# Patient Record
Sex: Male | Born: 1975 | Race: White | Hispanic: No | Marital: Married | Smoking: Never smoker
Health system: Southern US, Community
[De-identification: ages and names within clinical notes are randomized; demographics above are authoritative.]

## PROBLEM LIST (undated history)

## (undated) DIAGNOSIS — F32A Depression, unspecified: Secondary | ICD-10-CM

## (undated) DIAGNOSIS — E785 Hyperlipidemia, unspecified: Secondary | ICD-10-CM

## (undated) DIAGNOSIS — F329 Major depressive disorder, single episode, unspecified: Secondary | ICD-10-CM

## (undated) DIAGNOSIS — F909 Attention-deficit hyperactivity disorder, unspecified type: Secondary | ICD-10-CM

## (undated) DIAGNOSIS — N200 Calculus of kidney: Secondary | ICD-10-CM

## (undated) DIAGNOSIS — J189 Pneumonia, unspecified organism: Secondary | ICD-10-CM

## (undated) DIAGNOSIS — R112 Nausea with vomiting, unspecified: Secondary | ICD-10-CM

## (undated) DIAGNOSIS — T8859XA Other complications of anesthesia, initial encounter: Secondary | ICD-10-CM

## (undated) DIAGNOSIS — Z9889 Other specified postprocedural states: Secondary | ICD-10-CM

## (undated) DIAGNOSIS — F419 Anxiety disorder, unspecified: Secondary | ICD-10-CM

## (undated) DIAGNOSIS — I10 Essential (primary) hypertension: Secondary | ICD-10-CM

## (undated) DIAGNOSIS — T4145XA Adverse effect of unspecified anesthetic, initial encounter: Secondary | ICD-10-CM

## (undated) HISTORY — DX: Essential (primary) hypertension: I10

## (undated) HISTORY — DX: Hyperlipidemia, unspecified: E78.5

## (undated) HISTORY — DX: Depression, unspecified: F32.A

## (undated) HISTORY — DX: Major depressive disorder, single episode, unspecified: F32.9

## (undated) HISTORY — DX: Attention-deficit hyperactivity disorder, unspecified type: F90.9

## (undated) HISTORY — PX: ORTHOPEDIC SURGERY: SHX850

---

## 2008-01-10 ENCOUNTER — Ambulatory Visit: Payer: Self-pay | Admitting: Family Medicine

## 2008-01-15 ENCOUNTER — Telehealth (INDEPENDENT_AMBULATORY_CARE_PROVIDER_SITE_OTHER): Payer: Self-pay | Admitting: *Deleted

## 2008-10-15 ENCOUNTER — Ambulatory Visit: Payer: Self-pay | Admitting: Family Medicine

## 2008-10-15 DIAGNOSIS — M25569 Pain in unspecified knee: Secondary | ICD-10-CM | POA: Insufficient documentation

## 2008-10-15 DIAGNOSIS — M25519 Pain in unspecified shoulder: Secondary | ICD-10-CM | POA: Insufficient documentation

## 2008-10-15 DIAGNOSIS — M79609 Pain in unspecified limb: Secondary | ICD-10-CM

## 2008-10-19 ENCOUNTER — Encounter: Payer: Self-pay | Admitting: Family Medicine

## 2008-11-06 ENCOUNTER — Ambulatory Visit: Payer: Self-pay | Admitting: Diagnostic Radiology

## 2008-11-06 ENCOUNTER — Emergency Department (HOSPITAL_BASED_OUTPATIENT_CLINIC_OR_DEPARTMENT_OTHER): Admission: EM | Admit: 2008-11-06 | Discharge: 2008-11-07 | Payer: Self-pay | Admitting: Emergency Medicine

## 2009-11-29 ENCOUNTER — Ambulatory Visit: Payer: Self-pay | Admitting: Internal Medicine

## 2011-12-23 ENCOUNTER — Emergency Department (HOSPITAL_BASED_OUTPATIENT_CLINIC_OR_DEPARTMENT_OTHER)
Admission: EM | Admit: 2011-12-23 | Discharge: 2011-12-24 | Disposition: A | Discharge status: Home / Self Care | Payer: Managed Care, Other (non HMO) | Attending: Emergency Medicine | Admitting: Emergency Medicine

## 2011-12-23 ENCOUNTER — Emergency Department (HOSPITAL_BASED_OUTPATIENT_CLINIC_OR_DEPARTMENT_OTHER): Payer: Managed Care, Other (non HMO)

## 2011-12-23 ENCOUNTER — Encounter (HOSPITAL_BASED_OUTPATIENT_CLINIC_OR_DEPARTMENT_OTHER): Payer: Self-pay | Admitting: Emergency Medicine

## 2011-12-23 DIAGNOSIS — S46909A Unspecified injury of unspecified muscle, fascia and tendon at shoulder and upper arm level, unspecified arm, initial encounter: Secondary | ICD-10-CM | POA: Insufficient documentation

## 2011-12-23 DIAGNOSIS — S43109A Unspecified dislocation of unspecified acromioclavicular joint, initial encounter: Secondary | ICD-10-CM

## 2011-12-23 DIAGNOSIS — S4980XA Other specified injuries of shoulder and upper arm, unspecified arm, initial encounter: Secondary | ICD-10-CM | POA: Insufficient documentation

## 2011-12-23 MED ORDER — HYDROCODONE-ACETAMINOPHEN 5-325 MG PO TABS: 1.0000 | ORAL_TABLET | Freq: Four times a day (QID) | ORAL | Status: DC | PRN

## 2011-12-23 MED ORDER — NAPROXEN 250 MG PO TABS
500.0000 mg | ORAL_TABLET | Freq: Two times a day (BID) | ORAL | Status: DC
  Administered 2011-12-24: 500 mg via ORAL

## 2011-12-23 MED ORDER — HYDROCODONE-ACETAMINOPHEN 5-325 MG PO TABS
1.0000 | ORAL_TABLET | Freq: Once | ORAL | Status: AC
  Administered 2011-12-24: 1 via ORAL
  Filled 2011-12-23: qty 1
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
## 2011-12-24 MED ORDER — NAPROXEN 250 MG PO TABS
ORAL_TABLET | ORAL | Status: AC
  Filled 2011-12-24: qty 2

## 2012-06-20 ENCOUNTER — Ambulatory Visit: Payer: Managed Care, Other (non HMO) | Admitting: Family Medicine

## 2012-08-05 ENCOUNTER — Encounter: Payer: Self-pay | Admitting: Family Medicine

## 2012-08-05 ENCOUNTER — Ambulatory Visit (INDEPENDENT_AMBULATORY_CARE_PROVIDER_SITE_OTHER): Payer: Managed Care, Other (non HMO) | Admitting: Family Medicine

## 2012-08-05 VITALS — BP 122/90 | HR 69 | Temp 98.5°F | Ht 70.25 in | Wt 182.0 lb

## 2012-08-05 DIAGNOSIS — F988 Other specified behavioral and emotional disorders with onset usually occurring in childhood and adolescence: Secondary | ICD-10-CM

## 2012-08-05 MED ORDER — AMPHETAMINE-DEXTROAMPHET ER 20 MG PO CP24: 20.0000 mg | ORAL_CAPSULE | ORAL | Status: DC

## 2012-08-05 NOTE — Patient Instructions (Addendum)
Follow up in 1 month to recheck ADD and blood pressure Start the Adderall 1 tab daily If you have weight loss, palpitations, or insomnia- please call! Happy Memorial Day!

## 2012-08-05 NOTE — Progress Notes (Signed)
  Subjective:    Patient ID: Keith Johnson, male    DOB: 1976/03/15, 37 y.o.   MRN: [id number]  HPI Pt here today to re-establish.  Trouble concentrating- pt reports 'it's kinda always been there'.  Is doing more test taking for work and having a hard time studying and focusing, 'my mind's all over the place'.  Struggled in school w/ similar.  Will lose focus after reading about 3 lines.  Trouble focusing occurs both at home and at work.  Wife will frequently need to repeat herself.  Dad w/ similar sxs.  Has been on meds previously- ritalin w/ some improvement in grades.  Dad takes Adderall.  Had ADD testing done while in school.  Middle son is currently on ADD meds- has been on 4 different meds.  Pt very aware of meds.  Has taken Adderall w/ good results.   Review of Systems For ROS see HPI     Objective:   Physical Exam  Vitals reviewed. Constitutional: He is oriented to person, place, and time. He appears well-developed and well-nourished. No distress.  HENT:  Head: Normocephalic and atraumatic.  Neck: Normal range of motion. Neck supple. No thyromegaly present.  Cardiovascular: Normal rate, regular rhythm, normal heart sounds and intact distal pulses.   Pulmonary/Chest: Effort normal and breath sounds normal. No respiratory distress. He has no wheezes. He has no rales.  Musculoskeletal: He exhibits no edema.  Lymphadenopathy:    He has no cervical adenopathy.  Neurological: He is alert and oriented to person, place, and time. No cranial nerve deficit.  Skin: Skin is warm and dry.  Psychiatric: He has a normal mood and affect. His behavior is normal. Thought content normal.          Assessment & Plan:

## 2012-08-06 NOTE — Assessment & Plan Note (Signed)
New to provider.  Recurrent for pt.  Start stimulant medication.  Reviewed controlled substance policy and pt completed UDS.  Will continue to follow closely.

## 2012-08-19 ENCOUNTER — Encounter: Payer: Self-pay | Admitting: Family Medicine

## 2012-09-05 ENCOUNTER — Ambulatory Visit: Payer: Managed Care, Other (non HMO) | Admitting: Family Medicine

## 2012-09-05 ENCOUNTER — Telehealth: Payer: Self-pay | Admitting: Family Medicine

## 2012-09-05 NOTE — Telephone Encounter (Signed)
Last refill:08-05-12 Last OV:08-05-12- f/u in 1 mos-has appt sch'ed for-09-10-12 Please advise.//AB/CMA

## 2012-09-05 NOTE — Telephone Encounter (Signed)
Patient's wife is calling and asking for a refill on the patient's Adderall Rx. States that he took his last one this morning. She also wants to know if he needs to keep his appointment in order to get this refilled.

## 2012-09-06 MED ORDER — AMPHETAMINE-DEXTROAMPHET ER 20 MG PO CP24: 20.0000 mg | ORAL_CAPSULE | ORAL | Status: DC

## 2012-09-06 NOTE — Telephone Encounter (Signed)
Ok for #30 

## 2012-09-06 NOTE — Telephone Encounter (Signed)
Spoke with the pt and informed him that Dr. Tawni Carnes to refill his Adderall,but he will need to keep his appt for f/u.  Pt agreed.//AB/CMA

## 2012-09-10 ENCOUNTER — Ambulatory Visit (INDEPENDENT_AMBULATORY_CARE_PROVIDER_SITE_OTHER): Payer: Managed Care, Other (non HMO) | Admitting: Family Medicine

## 2012-09-10 ENCOUNTER — Encounter: Payer: Self-pay | Admitting: Family Medicine

## 2012-09-10 VITALS — BP 138/80 | HR 69 | Temp 98.1°F | Ht 70.25 in | Wt 183.2 lb

## 2012-09-10 DIAGNOSIS — Z0000 Encounter for general adult medical examination without abnormal findings: Secondary | ICD-10-CM

## 2012-09-10 DIAGNOSIS — F988 Other specified behavioral and emotional disorders with onset usually occurring in childhood and adolescence: Secondary | ICD-10-CM

## 2012-09-10 LAB — CBC WITH DIFFERENTIAL/PLATELET
Basophils Relative: 0.4 % (ref 0.0–3.0)
Eosinophils Absolute: 0.5 10*3/uL (ref 0.0–0.7)
HCT: 44.7 % (ref 39.0–52.0)
Lymphs Abs: 1.9 10*3/uL (ref 0.7–4.0)
MCHC: 33.3 g/dL (ref 30.0–36.0)
MCV: 94.4 fl (ref 78.0–100.0)
Monocytes Absolute: 0.6 10*3/uL (ref 0.1–1.0)
Neutrophils Relative %: 48.5 % (ref 43.0–77.0)
Platelets: 227 10*3/uL (ref 150.0–400.0)

## 2012-09-10 LAB — TSH: TSH: 1.81 u[IU]/mL (ref 0.35–5.50)

## 2012-09-10 LAB — BASIC METABOLIC PANEL
BUN: 14 mg/dL (ref 6–23)
Calcium: 9.6 mg/dL (ref 8.4–10.5)
GFR: 92.73 mL/min (ref >60.00)
Potassium: 4.2 mEq/L (ref 3.5–5.1)
Sodium: 142 mEq/L (ref 135–145)

## 2012-09-10 LAB — LIPID PANEL
Cholesterol: 222 mg/dL — ABNORMAL HIGH (ref 0–200)
HDL: 42.2 mg/dL (ref >39.00)
Triglycerides: 325 mg/dL — ABNORMAL HIGH (ref 0.0–149.0)
VLDL: 65 mg/dL — ABNORMAL HIGH (ref 0.0–40.0)

## 2012-09-10 LAB — HEPATIC FUNCTION PANEL
AST: 19 U/L (ref 0–37)
Total Bilirubin: 0.6 mg/dL (ref 0.3–1.2)

## 2012-09-10 NOTE — Patient Instructions (Addendum)
Schedule your complete physical at your convenience Continue the Adderall daily Call with any questions or concerns Have a great summer!!!

## 2012-09-10 NOTE — Progress Notes (Signed)
  Subjective:    Patient ID: Keith Johnson, male    DOB: August 26, 1975, 37 y.o.   MRN: [id number]  HPI ADD- pt feels sxs have improved since starting Adderall.  No insomnia.  No palpitations.  Slightly less appetite.  No weight loss.  Able to concentrate and finish tasks.   Review of Systems For ROS see HPI     Objective:   Physical Exam  Vitals reviewed. Constitutional: He is oriented to person, place, and time. He appears well-developed and well-nourished. No distress.  Cardiovascular: Normal rate, regular rhythm, normal heart sounds and intact distal pulses.   Pulmonary/Chest: Effort normal and breath sounds normal. No respiratory distress. He has no wheezes. He has no rales.  Neurological: He is alert and oriented to person, place, and time.  Skin: Skin is warm and dry.  Psychiatric: He has a normal mood and affect. His behavior is normal. Judgment and thought content normal.          Assessment & Plan:

## 2012-09-10 NOTE — Assessment & Plan Note (Signed)
Improved since starting meds.  BP within acceptable range.  No side effects at this time.  Will follow.

## 2012-09-17 ENCOUNTER — Telehealth: Payer: Self-pay | Admitting: *Deleted

## 2012-09-17 MED ORDER — FENOFIBRATE 160 MG PO TABS: 160.0000 mg | ORAL_TABLET | Freq: Every day | ORAL | Status: DC

## 2012-09-17 NOTE — Telephone Encounter (Signed)
Message copied by Verdie Shire on Tue Sep 17, 2012  5:43 PM ------      Message from: Sheliah Hatch      Created: Wed Sep 11, 2012  8:41 AM       Labs look great w/ exception of trigs- these will improve w/ healthy diet and regular exercise.  Would also recommend starting Fenofibrate 160mg  to bring this down ------

## 2012-09-17 NOTE — Telephone Encounter (Signed)
Spoke with the pt and informed him of recent lab results and note.  Pt understood and agreed.  New rx sent to the pharmacy by e-script.//AB/CMA

## 2012-10-09 ENCOUNTER — Other Ambulatory Visit: Payer: Self-pay | Admitting: Family Medicine

## 2012-10-09 ENCOUNTER — Ambulatory Visit (INDEPENDENT_AMBULATORY_CARE_PROVIDER_SITE_OTHER): Payer: Managed Care, Other (non HMO) | Admitting: Family Medicine

## 2012-10-09 ENCOUNTER — Ambulatory Visit
Admission: RE | Admit: 2012-10-09 | Discharge: 2012-10-09 | Disposition: A | Discharge status: Home / Self Care | Payer: Managed Care, Other (non HMO) | Source: Ambulatory Visit | Attending: Family Medicine | Admitting: Family Medicine

## 2012-10-09 ENCOUNTER — Encounter: Payer: Self-pay | Admitting: Family Medicine

## 2012-10-09 VITALS — BP 130/78 | HR 70 | Temp 98.7°F | Ht 70.25 in | Wt 176.4 lb

## 2012-10-09 DIAGNOSIS — R0781 Pleurodynia: Secondary | ICD-10-CM

## 2012-10-09 DIAGNOSIS — F988 Other specified behavioral and emotional disorders with onset usually occurring in childhood and adolescence: Secondary | ICD-10-CM

## 2012-10-09 DIAGNOSIS — R079 Chest pain, unspecified: Secondary | ICD-10-CM

## 2012-10-09 MED ORDER — AMPHETAMINE-DEXTROAMPHET ER 20 MG PO CP24: 20.0000 mg | ORAL_CAPSULE | ORAL | Status: DC

## 2012-10-09 NOTE — Patient Instructions (Addendum)
Go get your xray at [address] [facility]) ground floor We'll notify you of your results Alternate ice and heat Vicodin for bed Ibuprofen during the day Call with any questions or concerns Hang in there!!!
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
## 2012-10-09 NOTE — Assessment & Plan Note (Signed)
Refill provided
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
## 2012-11-11 ENCOUNTER — Telehealth: Payer: Self-pay | Admitting: Family Medicine

## 2012-11-11 NOTE — Telephone Encounter (Signed)
Patient's wife is calling to request a refill on the patient's Adderall rx. She says that Dr. Beverely Low mentioned to her that they may be able to get a 90-day supply on his next refill.

## 2012-11-11 NOTE — Telephone Encounter (Signed)
Last filled: 10-10-2102 Last OV:10-09-2012 UDS: 07-26-2012-Low Risk  Contract on file  Please advise. SW, CMA

## 2012-11-12 MED ORDER — AMPHETAMINE-DEXTROAMPHET ER 20 MG PO CP24: 20.0000 mg | ORAL_CAPSULE | ORAL | Status: DC

## 2012-11-12 NOTE — Telephone Encounter (Signed)
Ok for #90, no refill 

## 2012-11-12 NOTE — Telephone Encounter (Signed)
Filled as three months supply. Last Rx to be filled on 01/12/2013.

## 2013-01-28 ENCOUNTER — Telehealth: Payer: Self-pay | Admitting: Family Medicine

## 2013-01-28 MED ORDER — AMPHETAMINE-DEXTROAMPHET ER 20 MG PO CP24: 20.0000 mg | ORAL_CAPSULE | ORAL | Status: DC

## 2013-01-28 NOTE — Telephone Encounter (Signed)
Med filled #90 pt will be notified.

## 2013-01-28 NOTE — Telephone Encounter (Signed)
Last OV 7-23 Med filled for #90 on 8/26

## 2013-01-28 NOTE — Telephone Encounter (Signed)
Patient needs a refill on his Adderall rx. He has enough to last until the last week of this month but is asking for another 90-day supply. Please advise.

## 2013-01-28 NOTE — Telephone Encounter (Signed)
Ok for #90, no refills 

## 2013-05-10 ENCOUNTER — Other Ambulatory Visit: Payer: Self-pay | Admitting: Family Medicine

## 2013-05-13 NOTE — Telephone Encounter (Signed)
Med filled.  

## 2013-05-30 ENCOUNTER — Ambulatory Visit: Payer: Managed Care, Other (non HMO) | Admitting: Family Medicine

## 2013-06-02 ENCOUNTER — Encounter: Payer: Self-pay | Admitting: Family Medicine

## 2013-06-02 ENCOUNTER — Ambulatory Visit (INDEPENDENT_AMBULATORY_CARE_PROVIDER_SITE_OTHER): Payer: Managed Care, Other (non HMO) | Admitting: Family Medicine

## 2013-06-02 VITALS — BP 130/86 | HR 86 | Temp 98.2°F | Resp 16 | Wt 179.1 lb

## 2013-06-02 DIAGNOSIS — F988 Other specified behavioral and emotional disorders with onset usually occurring in childhood and adolescence: Secondary | ICD-10-CM

## 2013-06-02 DIAGNOSIS — E781 Pure hyperglyceridemia: Secondary | ICD-10-CM | POA: Insufficient documentation

## 2013-06-02 DIAGNOSIS — I10 Essential (primary) hypertension: Secondary | ICD-10-CM

## 2013-06-02 LAB — HEPATIC FUNCTION PANEL
ALBUMIN: 4.6 g/dL (ref 3.5–5.2)
ALT: 22 U/L (ref 0–53)
AST: 23 U/L (ref 0–37)
Alkaline Phosphatase: 52 U/L (ref 39–117)
Bilirubin, Direct: 0 mg/dL (ref 0.0–0.3)
TOTAL PROTEIN: 7.6 g/dL (ref 6.0–8.3)
Total Bilirubin: 0.9 mg/dL (ref 0.3–1.2)

## 2013-06-02 LAB — BASIC METABOLIC PANEL
BUN: 12 mg/dL (ref 6–23)
CALCIUM: 9.3 mg/dL (ref 8.4–10.5)
CO2: 28 mEq/L (ref 19–32)
CREATININE: 1 mg/dL (ref 0.4–1.5)
Chloride: 108 mEq/L (ref 96–112)
GFR: 92.34 mL/min (ref >60.00)
Glucose, Bld: 88 mg/dL (ref 70–99)
Potassium: 3.6 mEq/L (ref 3.5–5.1)
Sodium: 142 mEq/L (ref 135–145)

## 2013-06-02 LAB — LIPID PANEL
Cholesterol: 220 mg/dL — ABNORMAL HIGH (ref 0–200)
HDL: 48.8 mg/dL (ref >39.00)
LDL Cholesterol: 153 mg/dL — ABNORMAL HIGH (ref 0–99)
TRIGLYCERIDES: 91 mg/dL (ref 0.0–149.0)
Total CHOL/HDL Ratio: 5
VLDL: 18.2 mg/dL (ref 0.0–40.0)

## 2013-06-02 LAB — CBC WITH DIFFERENTIAL/PLATELET
BASOS PCT: 0.4 % (ref 0.0–3.0)
Basophils Absolute: 0 10*3/uL (ref 0.0–0.1)
EOS ABS: 0.5 10*3/uL (ref 0.0–0.7)
EOS PCT: 8.4 % — AB (ref 0.0–5.0)
HCT: 39.9 % (ref 39.0–52.0)
Hemoglobin: 13.3 g/dL (ref 13.0–17.0)
LYMPHS PCT: 30.3 % (ref 12.0–46.0)
Lymphs Abs: 1.7 10*3/uL (ref 0.7–4.0)
MCHC: 33.3 g/dL (ref 30.0–36.0)
MCV: 92.8 fl (ref 78.0–100.0)
Monocytes Absolute: 0.6 10*3/uL (ref 0.1–1.0)
Monocytes Relative: 10.5 % (ref 3.0–12.0)
NEUTROS PCT: 50.4 % (ref 43.0–77.0)
Neutro Abs: 2.8 10*3/uL (ref 1.4–7.7)
Platelets: 245 10*3/uL (ref 150.0–400.0)
RBC: 4.3 Mil/uL (ref 4.22–5.81)
RDW: 13.7 % (ref 11.5–14.6)
WBC: 5.6 10*3/uL (ref 4.5–10.5)

## 2013-06-02 LAB — TSH: TSH: 1.82 u[IU]/mL (ref 0.35–5.50)

## 2013-06-02 MED ORDER — AMPHETAMINE-DEXTROAMPHET ER 20 MG PO CP24: 20.0000 mg | ORAL_CAPSULE | ORAL | Status: DC

## 2013-06-02 MED ORDER — VALSARTAN 80 MG PO TABS: 80.0000 mg | ORAL_TABLET | Freq: Every day | ORAL | Status: DC

## 2013-06-02 NOTE — Patient Instructions (Signed)
Follow up in 3-4 weeks to recheck BP Start the Valsartan daily for blood pressure Try and limit your sodium (salt) intake, get regular exercise, and limit your caffeine to improve BP Call with any questions or concerns Hang in there!!!

## 2013-06-02 NOTE — Progress Notes (Signed)
Pre visit review using our clinic review tool, if applicable. No additional management support is needed unless otherwise documented below in the visit note. 

## 2013-06-02 NOTE — Progress Notes (Signed)
   Subjective:    Patient ID: Jeffie PollockDaniel Hoffmaster, male    DOB: 1975/09/09, 38 y.o.   MRN: [id number]  HPI Elevated BP- pt reports home BPs are 150s/90s.  No visual changes.  Intermittent HAs.  Rare, intermittent chest ache.  No SOB.  No edema.  + family hx of HTN.  Pt notes elevated BPs x3-4 months.  Has tried stopping Adderall to see if this improves BP- no change noted.  ADD- chronic problem, on Adderall.    Hypertriglyceridemia- noted on last labs, on fenofibrate.   Review of Systems For ROS see HPI     Objective:   Physical Exam  Vitals reviewed. Constitutional: He is oriented to person, place, and time. He appears well-developed and well-nourished. No distress.  HENT:  Head: Normocephalic and atraumatic.  Eyes: Conjunctivae and EOM are normal. Pupils are equal, round, and reactive to light.  Neck: Normal range of motion. Neck supple. No thyromegaly present.  Cardiovascular: Normal rate, regular rhythm, normal heart sounds and intact distal pulses.   No murmur heard. Pulmonary/Chest: Effort normal and breath sounds normal. No respiratory distress.  Abdominal: Soft. Bowel sounds are normal. He exhibits no distension.  Musculoskeletal: He exhibits no edema.  Lymphadenopathy:    He has no cervical adenopathy.  Neurological: He is alert and oriented to person, place, and time. No cranial nerve deficit.  Skin: Skin is warm and dry.  Psychiatric: He has a normal mood and affect. His behavior is normal.          Assessment & Plan:

## 2013-06-02 NOTE — Assessment & Plan Note (Signed)
Noted on last labs.  Pt on fenofibrate w/o difficulty.  Check labs.  Adjust meds prn

## 2013-06-02 NOTE — Assessment & Plan Note (Signed)
New.  Pt w/ strong family hx of HTN.  BP has been elevated for ~3 months and did not fluctuate or improve when pt stopped stimulant medication.  Discussed need for low sodium diet, limited caffeine intake, stress outlet.  Will start low dose ARB and monitor closely.  Pt expressed understanding and is in agreement w/ plan.

## 2013-06-02 NOTE — Assessment & Plan Note (Signed)
Chronic problem.  Refill provided for meds.

## 2013-06-03 ENCOUNTER — Telehealth: Payer: Self-pay | Admitting: Family Medicine

## 2013-06-03 NOTE — Telephone Encounter (Signed)
Relevant patient education assigned to patient using Emmi. ° °

## 2013-07-01 ENCOUNTER — Encounter: Payer: Self-pay | Admitting: Family Medicine

## 2013-08-01 ENCOUNTER — Encounter: Payer: Self-pay | Admitting: Family Medicine

## 2013-08-01 ENCOUNTER — Ambulatory Visit (INDEPENDENT_AMBULATORY_CARE_PROVIDER_SITE_OTHER): Payer: Managed Care, Other (non HMO) | Admitting: Family Medicine

## 2013-08-01 VITALS — BP 136/86 | HR 97 | Temp 98.2°F | Resp 16 | Wt 178.2 lb

## 2013-08-01 DIAGNOSIS — J0110 Acute frontal sinusitis, unspecified: Secondary | ICD-10-CM

## 2013-08-01 DIAGNOSIS — H6120 Impacted cerumen, unspecified ear: Secondary | ICD-10-CM

## 2013-08-01 MED ORDER — AMOXICILLIN 875 MG PO TABS: 875.0000 mg | ORAL_TABLET | Freq: Two times a day (BID) | ORAL | Status: DC

## 2013-08-01 NOTE — Assessment & Plan Note (Signed)
Pt's sxs and PE consistent w/ infxn.  Start abx.  Reviewed supportive care and red flags that should prompt return.  Pt expressed understanding and is in agreement w/ plan.  

## 2013-08-01 NOTE — Assessment & Plan Note (Addendum)
New.  Unable to curette successfully.  Irrigated w/ results.  Wax very hard and impacted.  Attempts at curette were painful for pt.  Stopped attempts and instructed pt to use H2O2 daily in both ears to attempt to soften and return in 10-14 days for removal.  Pt expressed understanding and is in agreement w/ plan.

## 2013-08-01 NOTE — Patient Instructions (Signed)
Follow up as needed Start the Amoxicillin twice daily- take w/ food Drink plenty of fluids Mucinex to thin congestion Call with any questions or concerns Hang in there!!

## 2013-08-01 NOTE — Progress Notes (Signed)
Pre visit review using our clinic review tool, if applicable. No additional management support is needed unless otherwise documented below in the visit note. 

## 2013-08-01 NOTE — Progress Notes (Signed)
   Subjective:    Patient ID: Keith Johnson, male    DOB: 1975/12/06, 38 y.o.   MRN: [id number]  HPI URI- sxs started 2 weeks ago w/ nasal congestion.  No fever.  Now w/ 3 days of facial pain, concentrated over L eye.  No ear pain.  Minimal cough.  No N/V/D.  + HA.  Taking OTC Mucinex w/o relief.  Pain to lean forward.   Review of Systems For ROS see HPI     Objective:   Physical Exam  Vitals reviewed. Constitutional: He appears well-developed and well-nourished. No distress.  HENT:  Head: Normocephalic and atraumatic.  Nose: Mucosal edema and rhinorrhea present. Right sinus exhibits no maxillary sinus tenderness and no frontal sinus tenderness. Left sinus exhibits frontal sinus tenderness. Left sinus exhibits no maxillary sinus tenderness.  Mouth/Throat: Mucous membranes are normal. Oropharyngeal exudate and posterior oropharyngeal erythema present. No posterior oropharyngeal edema.  TM's obscured by wax bilaterally + PND  Eyes: Conjunctivae and EOM are normal. Pupils are equal, round, and reactive to light.  Neck: Normal range of motion. Neck supple.  Cardiovascular: Normal rate, regular rhythm and normal heart sounds.   Pulmonary/Chest: Effort normal and breath sounds normal. No respiratory distress. He has no wheezes.  Lymphadenopathy:    He has no cervical adenopathy.  Skin: Skin is warm and dry.          Assessment & Plan:

## 2013-09-10 ENCOUNTER — Ambulatory Visit (INDEPENDENT_AMBULATORY_CARE_PROVIDER_SITE_OTHER): Payer: Managed Care, Other (non HMO) | Admitting: Family Medicine

## 2013-09-10 ENCOUNTER — Encounter: Payer: Self-pay | Admitting: Family Medicine

## 2013-09-10 VITALS — BP 130/80 | HR 68 | Temp 97.9°F | Resp 16 | Wt 176.5 lb

## 2013-09-10 DIAGNOSIS — J3089 Other allergic rhinitis: Secondary | ICD-10-CM

## 2013-09-10 DIAGNOSIS — J309 Allergic rhinitis, unspecified: Secondary | ICD-10-CM | POA: Insufficient documentation

## 2013-09-10 DIAGNOSIS — H6123 Impacted cerumen, bilateral: Secondary | ICD-10-CM

## 2013-09-10 DIAGNOSIS — H6120 Impacted cerumen, unspecified ear: Secondary | ICD-10-CM

## 2013-09-10 MED ORDER — AMPHETAMINE-DEXTROAMPHET ER 20 MG PO CP24: 20.0000 mg | ORAL_CAPSULE | ORAL | Status: DC

## 2013-09-10 MED ORDER — FLUTICASONE PROPIONATE 50 MCG/ACT NA SUSP: 2.0000 | Freq: Every day | NASAL | Status: DC

## 2013-09-10 NOTE — Assessment & Plan Note (Addendum)
Despite pt's home use of H2O2, he continues to have hard, painfully adhered balls or wax in canals bilaterally.  Was able to remove the soft wax in front of the solid ball but was unable to remove the remainder of wax w/ either curette or irrigation.  Refer to ENT.

## 2013-09-10 NOTE — Patient Instructions (Signed)
Follow up as needed Start Claritin or Zyrtec daily for nasal congestion and post nasal drip- this is what's causing your cough Drink plenty of fluids Flonase- 2 sprays each nostril daily- to decrease post nasal drip and improve cough Continue Mucinex DM for daytime cough Call with any questions or concerns Have a great summer!

## 2013-09-10 NOTE — Assessment & Plan Note (Addendum)
New.  Lungs CTAB but copious PND on PE.  Start daily antihistamine, nasal steroid to decrease congestion and drainage and improve cough.  Reviewed supportive care and red flags that should prompt return.  Pt expressed understanding and is in agreement w/ plan.

## 2013-09-10 NOTE — Progress Notes (Signed)
   Subjective:    Patient ID: Keith CostainDaniel T Johnson, male    DOB: May 30, 1975, 38 y.o.   MRN: [id number]  HPI Cough- sxs started ~1 month ago after sinus infxn resolved.  No fevers.  Hacking, productive cough.  No facial pain/pressure.  Having some AM nasal drainage and congestion.  Cerumen impaction- chronic problem for pt.  Has been using peroxide regularly w/ some improvement in ear congestion.   Review of Systems For ROS see HPI     Objective:   Physical Exam  Vitals reviewed. Constitutional: He appears well-developed and well-nourished. No distress.  HENT:  Head: Normocephalic and atraumatic.  No TTP over sinuses + turbinate edema + PND TMs obscured by cerumen bilaterally.  Able to curette a small amount of foul smelling soft cerumen but pt has large amount of hard, adhered cerumen in ear canals bilaterally- attempting to irrigate  Eyes: Conjunctivae and EOM are normal. Pupils are equal, round, and reactive to light.  Neck: Normal range of motion. Neck supple.  Cardiovascular: Normal rate, regular rhythm and normal heart sounds.   Pulmonary/Chest: Effort normal and breath sounds normal. No respiratory distress. He has no wheezes.  + hacking cough  Lymphadenopathy:    He has no cervical adenopathy.  Skin: Skin is warm and dry.          Assessment & Plan:

## 2013-09-10 NOTE — Progress Notes (Signed)
Pre visit review using our clinic review tool, if applicable. No additional management support is needed unless otherwise documented below in the visit note. 

## 2013-10-05 ENCOUNTER — Other Ambulatory Visit: Payer: Self-pay | Admitting: Family Medicine

## 2013-10-06 NOTE — Telephone Encounter (Signed)
Med filled.  

## 2013-12-12 ENCOUNTER — Telehealth: Payer: Self-pay

## 2013-12-12 MED ORDER — AMPHETAMINE-DEXTROAMPHET ER 20 MG PO CP24: 20.0000 mg | ORAL_CAPSULE | ORAL | Status: DC

## 2013-12-12 NOTE — Telephone Encounter (Signed)
Apolonio Cutting Spouse [phone]  Erin called to say Keith Johnson just used his last amphetamine-dextroamphetamine (ADDERALL XR) 20 MG 24 hr capsule, she wanted to know if he could get 3 months of prescriptions. Call when ready

## 2013-12-12 NOTE — Telephone Encounter (Signed)
Med filled and pt notified.  

## 2013-12-12 NOTE — Telephone Encounter (Signed)
Last OV 09-10-13 Med filled 90 days at that time.

## 2013-12-12 NOTE — Telephone Encounter (Signed)
Ok for 3 months supply

## 2013-12-24 ENCOUNTER — Other Ambulatory Visit: Payer: Self-pay | Admitting: General Practice

## 2013-12-24 MED ORDER — FENOFIBRATE 160 MG PO TABS: ORAL_TABLET | ORAL | Status: DC

## 2014-03-10 ENCOUNTER — Other Ambulatory Visit: Payer: Self-pay | Admitting: General Practice

## 2014-03-10 MED ORDER — VALSARTAN 80 MG PO TABS: ORAL_TABLET | ORAL | Status: DC

## 2014-04-28 ENCOUNTER — Telehealth: Payer: Self-pay | Admitting: Family Medicine

## 2014-04-28 NOTE — Telephone Encounter (Signed)
Caller name: erin Relation to pt: wife Call back number: [phone] or [phone] Pharmacy:  Reason for call:   Requesting three month supply of adderall for patient

## 2014-04-29 MED ORDER — AMPHETAMINE-DEXTROAMPHET ER 20 MG PO CP24: 20.0000 mg | ORAL_CAPSULE | ORAL | Status: DC

## 2014-04-29 NOTE — Telephone Encounter (Signed)
Will notify pt that CPE is needed.

## 2014-04-29 NOTE — Telephone Encounter (Signed)
Ok for 3 month supply- please remind him to schedule CPE for when it is due

## 2014-04-29 NOTE — Telephone Encounter (Signed)
Last OV 09/10/13 (cerumen impaction) adderall last filled 12/12/13 with 3 months supply.   No upcoming appts

## 2014-07-29 ENCOUNTER — Telehealth: Payer: Self-pay | Admitting: Family Medicine

## 2014-07-29 NOTE — Telephone Encounter (Signed)
Pre Visit letter sent  °

## 2014-08-02 ENCOUNTER — Other Ambulatory Visit: Payer: Self-pay | Admitting: Family Medicine

## 2014-08-03 NOTE — Telephone Encounter (Signed)
Med filled, CPE scheduled for next month.

## 2014-08-06 ENCOUNTER — Other Ambulatory Visit: Payer: Self-pay | Admitting: General Practice

## 2014-08-06 MED ORDER — FENOFIBRATE 160 MG PO TABS: ORAL_TABLET | ORAL | Status: DC

## 2014-08-18 ENCOUNTER — Encounter: Payer: Self-pay | Admitting: *Deleted

## 2014-08-18 ENCOUNTER — Telehealth: Payer: Self-pay | Admitting: *Deleted

## 2014-08-18 NOTE — Telephone Encounter (Signed)
Pre-Visit Call completed with patient and chart updated.   Pre-Visit Info documented in Specialty Comments under [employer].    

## 2014-08-19 ENCOUNTER — Ambulatory Visit (INDEPENDENT_AMBULATORY_CARE_PROVIDER_SITE_OTHER): Payer: 59 | Admitting: Family Medicine

## 2014-08-19 ENCOUNTER — Encounter: Payer: Self-pay | Admitting: Family Medicine

## 2014-08-19 VITALS — BP 124/78 | HR 80 | Temp 98.0°F | Resp 16 | Ht 70.0 in | Wt 179.0 lb

## 2014-08-19 DIAGNOSIS — Z0000 Encounter for general adult medical examination without abnormal findings: Secondary | ICD-10-CM | POA: Diagnosis not present

## 2014-08-19 DIAGNOSIS — F411 Generalized anxiety disorder: Secondary | ICD-10-CM | POA: Diagnosis not present

## 2014-08-19 MED ORDER — FLUOXETINE HCL 20 MG PO TABS: 20.0000 mg | ORAL_TABLET | Freq: Every day | ORAL | Status: DC

## 2014-08-19 MED ORDER — FENOFIBRATE 160 MG PO TABS: ORAL_TABLET | ORAL | Status: DC

## 2014-08-19 MED ORDER — AMPHETAMINE-DEXTROAMPHET ER 20 MG PO CP24: 20.0000 mg | ORAL_CAPSULE | ORAL | Status: DC

## 2014-08-19 MED ORDER — VALSARTAN 80 MG PO TABS: 80.0000 mg | ORAL_TABLET | Freq: Every day | ORAL | Status: DC

## 2014-08-19 NOTE — Progress Notes (Signed)
   Subjective:    Patient ID: Keith Johnson, male    DOB: 1975/04/11, 39 y.o.   MRN: [id number]  HPI CPE- pt reports wife told him to ask about 'crazy pills'.  Pt reports increased stress at work.  Increased snapping, shorter fuse.  Pt attempted to go off adderall b/c he was concerned this was the cause, and sxs were worse.  Denies sadness, 'easily irritated'.  Sleep is fairly good.  Denies stress at home.  'i feel like i care too much'.  'i don't believe in counseling'.   Review of Systems Patient reports no vision/hearing changes, anorexia, fever ,adenopathy, persistant/recurrent hoarseness, swallowing issues, chest pain, palpitations, edema, persistant/recurrent cough, hemoptysis, dyspnea (rest,exertional, paroxysmal nocturnal), gastrointestinal  bleeding (melena, rectal bleeding), abdominal pain, excessive heart burn, GU symptoms (dysuria, hematuria, voiding/incontinence issues) syncope, focal weakness, memory loss, numbness & tingling, skin/hair/nail changes, abnormal bruising/bleeding, musculoskeletal symptoms/signs.     Objective:   Physical Exam General Appearance:    Alert, cooperative, no distress, appears stated age  Head:    Normocephalic, without obvious abnormality, atraumatic  Eyes:    PERRL, conjunctiva/corneas clear, EOM's intact, fundi    benign, both eyes       Ears:    Normal TM's and external ear canals, both ears  Nose:   Nares normal, septum midline, mucosa normal, no drainage   or sinus tenderness  Throat:   Lips, mucosa, and tongue normal; teeth and gums normal  Neck:   Supple, symmetrical, trachea midline, no adenopathy;       thyroid:  No enlargement/tenderness/nodules  Back:     Symmetric, no curvature, ROM normal, no CVA tenderness  Lungs:     Clear to auscultation bilaterally, respirations unlabored  Chest wall:    No tenderness or deformity  Heart:    Regular rate and rhythm, S1 and S2 normal, no murmur, rub   or gallop  Abdomen:     Soft, non-tender,  bowel sounds active all four quadrants,    no masses, no organomegaly  Genitalia:    Normal male without lesion, masses,discharge or tenderness  Rectal:    Deferred due to young age  Extremities:   Extremities normal, atraumatic, no cyanosis or edema  Pulses:   2+ and symmetric all extremities  Skin:   Skin color, texture, turgor normal, no rashes or lesions  Lymph nodes:   Cervical, supraclavicular, and axillary nodes normal  Neurologic:   CNII-XII intact. Normal strength, sensation and reflexes      throughout          Assessment & Plan:

## 2014-08-19 NOTE — Progress Notes (Signed)
Pre visit review using our clinic review tool, if applicable. No additional management support is needed unless otherwise documented below in the visit note. 

## 2014-08-19 NOTE — Patient Instructions (Signed)
Follow up in 4-6 weeks to recheck anxiety We'll notify you of your lab results and make any changes if needed Start the Prozac daily for the anxiety Keep up the good work on healthy diet and regular exercise Call with any questions or concerns Hang in there!!!

## 2014-08-20 ENCOUNTER — Encounter: Payer: Self-pay | Admitting: General Practice

## 2014-08-20 LAB — HEPATIC FUNCTION PANEL
ALK PHOS: 43 U/L (ref 39–117)
ALT: 19 U/L (ref 0–53)
AST: 21 U/L (ref 0–37)
Albumin: 4.5 g/dL (ref 3.5–5.2)
BILIRUBIN TOTAL: 0.3 mg/dL (ref 0.2–1.2)
Bilirubin, Direct: 0 mg/dL (ref 0.0–0.3)
Total Protein: 7.3 g/dL (ref 6.0–8.3)

## 2014-08-20 LAB — CBC WITH DIFFERENTIAL/PLATELET
BASOS PCT: 0.3 % (ref 0.0–3.0)
Basophils Absolute: 0 10*3/uL (ref 0.0–0.1)
EOS PCT: 9.8 % — AB (ref 0.0–5.0)
Eosinophils Absolute: 0.6 10*3/uL (ref 0.0–0.7)
HCT: 37.7 % — ABNORMAL LOW (ref 39.0–52.0)
Hemoglobin: 12.5 g/dL — ABNORMAL LOW (ref 13.0–17.0)
LYMPHS ABS: 1.9 10*3/uL (ref 0.7–4.0)
Lymphocytes Relative: 30.4 % (ref 12.0–46.0)
MCHC: 33.1 g/dL (ref 30.0–36.0)
MCV: 93.2 fl (ref 78.0–100.0)
Monocytes Absolute: 0.6 10*3/uL (ref 0.1–1.0)
Monocytes Relative: 10 % (ref 3.0–12.0)
Neutro Abs: 3.1 10*3/uL (ref 1.4–7.7)
Neutrophils Relative %: 49.5 % (ref 43.0–77.0)
Platelets: 242 10*3/uL (ref 150.0–400.0)
RBC: 4.05 Mil/uL — ABNORMAL LOW (ref 4.22–5.81)
RDW: 13.1 % (ref 11.5–15.5)
WBC: 6.2 10*3/uL (ref 4.0–10.5)

## 2014-08-20 LAB — LIPID PANEL
CHOLESTEROL: 194 mg/dL (ref 0–200)
HDL: 49.3 mg/dL (ref >39.00)
NonHDL: 144.7
Total CHOL/HDL Ratio: 4
Triglycerides: 238 mg/dL — ABNORMAL HIGH (ref 0.0–149.0)
VLDL: 47.6 mg/dL — ABNORMAL HIGH (ref 0.0–40.0)

## 2014-08-20 LAB — BASIC METABOLIC PANEL
BUN: 12 mg/dL (ref 6–23)
CO2: 28 mEq/L (ref 19–32)
Calcium: 9.5 mg/dL (ref 8.4–10.5)
Chloride: 105 mEq/L (ref 96–112)
Creatinine, Ser: 1.11 mg/dL (ref 0.40–1.50)
GFR: 79.43 mL/min (ref >60.00)
GLUCOSE: 79 mg/dL (ref 70–99)
POTASSIUM: 3.9 meq/L (ref 3.5–5.1)
SODIUM: 139 meq/L (ref 135–145)

## 2014-08-20 LAB — TSH: TSH: 2.04 u[IU]/mL (ref 0.35–4.50)

## 2014-08-20 LAB — LDL CHOLESTEROL, DIRECT: LDL DIRECT: 105 mg/dL

## 2014-08-21 NOTE — Assessment & Plan Note (Signed)
Pt's PE WNL.  Check labs.  Anticipatory guidance provided.  

## 2014-08-21 NOTE — Assessment & Plan Note (Signed)
New.  Pt admits that this is interfering w/ his work and his wife has asked him to address this.  Start low dose SSRI.  Reviewed appropriate use, possible side effects- including suicidal ideation.  Will follow closely.

## 2014-09-05 ENCOUNTER — Other Ambulatory Visit: Payer: Self-pay | Admitting: Family Medicine

## 2014-09-07 NOTE — Telephone Encounter (Signed)
Med filled.  

## 2014-09-09 ENCOUNTER — Encounter (HOSPITAL_COMMUNITY): Payer: Self-pay | Admitting: *Deleted

## 2014-09-09 ENCOUNTER — Other Ambulatory Visit: Payer: Self-pay | Admitting: Orthopedic Surgery

## 2014-09-09 NOTE — Progress Notes (Signed)
Pt stated that he had some type of "problem" during his clavicle surgery in 2013 at [facility]. States he was never told what type of complication it was, but it was supposed to be a hour long surgery and it took 2 hours. He said he thought it had to do with anesthesia. Requesting anesthesia records from [facility].

## 2014-09-10 ENCOUNTER — Ambulatory Visit (HOSPITAL_COMMUNITY): Payer: 59 | Admitting: Anesthesiology

## 2014-09-10 ENCOUNTER — Ambulatory Visit (HOSPITAL_COMMUNITY)
Admission: RE | Admit: 2014-09-10 | Discharge: 2014-09-10 | Disposition: A | Discharge status: Home / Self Care | Payer: 59 | Source: Ambulatory Visit | Attending: Orthopedic Surgery | Admitting: Orthopedic Surgery

## 2014-09-10 ENCOUNTER — Encounter (HOSPITAL_COMMUNITY): Payer: Self-pay | Admitting: Certified Registered Nurse Anesthetist

## 2014-09-10 ENCOUNTER — Encounter (HOSPITAL_COMMUNITY)
Admission: RE | Disposition: A | Discharge status: Home / Self Care | Payer: Self-pay | Source: Ambulatory Visit | Attending: Orthopedic Surgery

## 2014-09-10 ENCOUNTER — Ambulatory Visit (HOSPITAL_COMMUNITY): Payer: 59

## 2014-09-10 DIAGNOSIS — Y999 Unspecified external cause status: Secondary | ICD-10-CM | POA: Diagnosis not present

## 2014-09-10 DIAGNOSIS — Y929 Unspecified place or not applicable: Secondary | ICD-10-CM | POA: Insufficient documentation

## 2014-09-10 DIAGNOSIS — R9431 Abnormal electrocardiogram [ECG] [EKG]: Secondary | ICD-10-CM | POA: Diagnosis not present

## 2014-09-10 DIAGNOSIS — F419 Anxiety disorder, unspecified: Secondary | ICD-10-CM | POA: Insufficient documentation

## 2014-09-10 DIAGNOSIS — I4510 Unspecified right bundle-branch block: Secondary | ICD-10-CM | POA: Diagnosis not present

## 2014-09-10 DIAGNOSIS — Z791 Long term (current) use of non-steroidal anti-inflammatories (NSAID): Secondary | ICD-10-CM | POA: Insufficient documentation

## 2014-09-10 DIAGNOSIS — Y939 Activity, unspecified: Secondary | ICD-10-CM | POA: Insufficient documentation

## 2014-09-10 DIAGNOSIS — Z79899 Other long term (current) drug therapy: Secondary | ICD-10-CM | POA: Insufficient documentation

## 2014-09-10 DIAGNOSIS — Z79891 Long term (current) use of opiate analgesic: Secondary | ICD-10-CM | POA: Insufficient documentation

## 2014-09-10 DIAGNOSIS — S42002A Fracture of unspecified part of left clavicle, initial encounter for closed fracture: Secondary | ICD-10-CM | POA: Diagnosis present

## 2014-09-10 DIAGNOSIS — I10 Essential (primary) hypertension: Secondary | ICD-10-CM | POA: Diagnosis not present

## 2014-09-10 DIAGNOSIS — S42009A Fracture of unspecified part of unspecified clavicle, initial encounter for closed fracture: Secondary | ICD-10-CM

## 2014-09-10 DIAGNOSIS — F988 Other specified behavioral and emotional disorders with onset usually occurring in childhood and adolescence: Secondary | ICD-10-CM | POA: Diagnosis not present

## 2014-09-10 HISTORY — DX: Other specified postprocedural states: Z98.890

## 2014-09-10 HISTORY — DX: Other specified postprocedural states: R11.2

## 2014-09-10 HISTORY — PX: ORIF CLAVICULAR FRACTURE: SHX5055

## 2014-09-10 HISTORY — DX: Adverse effect of unspecified anesthetic, initial encounter: T41.45XA

## 2014-09-10 HISTORY — DX: Pneumonia, unspecified organism: J18.9

## 2014-09-10 HISTORY — DX: Other complications of anesthesia, initial encounter: T88.59XA

## 2014-09-10 HISTORY — DX: Anxiety disorder, unspecified: F41.9

## 2014-09-10 LAB — CBC
HCT: 38 % — ABNORMAL LOW (ref 39.0–52.0)
HEMOGLOBIN: 12.6 g/dL — AB (ref 13.0–17.0)
MCH: 30.4 pg (ref 26.0–34.0)
MCHC: 33.2 g/dL (ref 30.0–36.0)
MCV: 91.6 fL (ref 78.0–100.0)
PLATELETS: 229 10*3/uL (ref 150–400)
RBC: 4.15 MIL/uL — AB (ref 4.22–5.81)
RDW: 12.5 % (ref 11.5–15.5)
WBC: 7 10*3/uL (ref 4.0–10.5)

## 2014-09-10 LAB — BASIC METABOLIC PANEL
Anion gap: 4 — ABNORMAL LOW (ref 5–15)
BUN: 11 mg/dL (ref 6–20)
CHLORIDE: 106 mmol/L (ref 101–111)
CO2: 27 mmol/L (ref 22–32)
CREATININE: 1.14 mg/dL (ref 0.61–1.24)
Calcium: 9.2 mg/dL (ref 8.9–10.3)
Glucose, Bld: 90 mg/dL (ref 65–99)
Potassium: 4.4 mmol/L (ref 3.5–5.1)
Sodium: 137 mmol/L (ref 135–145)

## 2014-09-10 SURGERY — OPEN REDUCTION INTERNAL FIXATION (ORIF) CLAVICULAR FRACTURE
Anesthesia: General | Site: Shoulder | Laterality: Left

## 2014-09-10 MED ORDER — ROCURONIUM BROMIDE 50 MG/5ML IV SOLN
INTRAVENOUS | Status: AC
  Filled 2014-09-10: qty 1

## 2014-09-10 MED ORDER — POVIDONE-IODINE 7.5 % EX SOLN
Freq: Once | CUTANEOUS | Status: DC
  Filled 2014-09-10: qty 118

## 2014-09-10 MED ORDER — MEPERIDINE HCL 25 MG/ML IJ SOLN
6.2500 mg | INTRAMUSCULAR | Status: DC | PRN
  Administered 2014-09-10: 6.25 mg via INTRAVENOUS

## 2014-09-10 MED ORDER — MIDAZOLAM HCL 5 MG/5ML IJ SOLN
INTRAMUSCULAR | Status: DC | PRN
  Administered 2014-09-10: 2 mg via INTRAVENOUS

## 2014-09-10 MED ORDER — DOCUSATE SODIUM 100 MG PO CAPS: 100.0000 mg | ORAL_CAPSULE | Freq: Three times a day (TID) | ORAL | Status: DC | PRN

## 2014-09-10 MED ORDER — MIDAZOLAM HCL 2 MG/2ML IJ SOLN
INTRAMUSCULAR | Status: AC
  Filled 2014-09-10: qty 2

## 2014-09-10 MED ORDER — OXYCODONE-ACETAMINOPHEN 5-325 MG PO TABS
2.0000 | ORAL_TABLET | Freq: Once | ORAL | Status: AC
  Administered 2014-09-10: 2 via ORAL

## 2014-09-10 MED ORDER — PROMETHAZINE HCL 25 MG/ML IJ SOLN: 6.2500 mg | INTRAMUSCULAR | Status: DC | PRN

## 2014-09-10 MED ORDER — LACTATED RINGERS IV SOLN
INTRAVENOUS | Status: DC
  Administered 2014-09-10 (×2): via INTRAVENOUS
  Administered 2014-09-10: 50 mL/h via INTRAVENOUS

## 2014-09-10 MED ORDER — FENTANYL CITRATE (PF) 250 MCG/5ML IJ SOLN
INTRAMUSCULAR | Status: AC
  Filled 2014-09-10: qty 5

## 2014-09-10 MED ORDER — PHENYLEPHRINE HCL 10 MG/ML IJ SOLN
INTRAMUSCULAR | Status: DC | PRN
  Administered 2014-09-10: 40 ug via INTRAVENOUS
  Administered 2014-09-10: 80 ug via INTRAVENOUS
  Administered 2014-09-10: 40 ug via INTRAVENOUS

## 2014-09-10 MED ORDER — ATROPINE SULFATE 0.1 MG/ML IJ SOLN
INTRAMUSCULAR | Status: AC
  Filled 2014-09-10: qty 10

## 2014-09-10 MED ORDER — SODIUM CHLORIDE 0.9 % IJ SOLN
INTRAMUSCULAR | Status: AC
  Filled 2014-09-10: qty 40

## 2014-09-10 MED ORDER — EPHEDRINE SULFATE 50 MG/ML IJ SOLN
INTRAMUSCULAR | Status: AC
  Filled 2014-09-10: qty 1

## 2014-09-10 MED ORDER — OXYCODONE-ACETAMINOPHEN 5-325 MG PO TABS: 1.0000 | ORAL_TABLET | ORAL | Status: DC | PRN

## 2014-09-10 MED ORDER — PROPOFOL 10 MG/ML IV BOLUS
INTRAVENOUS | Status: AC
  Filled 2014-09-10: qty 20

## 2014-09-10 MED ORDER — BUPIVACAINE-EPINEPHRINE (PF) 0.25% -1:200000 IJ SOLN
INTRAMUSCULAR | Status: AC
  Filled 2014-09-10: qty 30

## 2014-09-10 MED ORDER — FENTANYL CITRATE (PF) 100 MCG/2ML IJ SOLN
INTRAMUSCULAR | Status: DC | PRN
  Administered 2014-09-10 (×2): 100 ug via INTRAVENOUS

## 2014-09-10 MED ORDER — PROPOFOL 10 MG/ML IV BOLUS
INTRAVENOUS | Status: DC | PRN
  Administered 2014-09-10: 200 mg via INTRAVENOUS

## 2014-09-10 MED ORDER — DIPHENHYDRAMINE HCL 50 MG/ML IJ SOLN
INTRAMUSCULAR | Status: DC | PRN
  Administered 2014-09-10: 25 mg via INTRAVENOUS

## 2014-09-10 MED ORDER — LIDOCAINE HCL (CARDIAC) 20 MG/ML IV SOLN
INTRAVENOUS | Status: DC | PRN
  Administered 2014-09-10: 50 mg via INTRAVENOUS

## 2014-09-10 MED ORDER — MEPERIDINE HCL 25 MG/ML IJ SOLN
INTRAMUSCULAR | Status: AC
  Filled 2014-09-10: qty 1

## 2014-09-10 MED ORDER — PHENYLEPHRINE HCL 10 MG/ML IJ SOLN
10.0000 mg | INTRAVENOUS | Status: DC | PRN
  Administered 2014-09-10: 20 ug/min via INTRAVENOUS

## 2014-09-10 MED ORDER — CEFAZOLIN SODIUM-DEXTROSE 2-3 GM-% IV SOLR
2.0000 g | INTRAVENOUS | Status: AC
  Administered 2014-09-10: 2 g via INTRAVENOUS
  Filled 2014-09-10: qty 50

## 2014-09-10 MED ORDER — ROCURONIUM BROMIDE 100 MG/10ML IV SOLN
INTRAVENOUS | Status: DC | PRN
  Administered 2014-09-10: 50 mg via INTRAVENOUS

## 2014-09-10 MED ORDER — ALBUTEROL SULFATE HFA 108 (90 BASE) MCG/ACT IN AERS
INHALATION_SPRAY | RESPIRATORY_TRACT | Status: AC
  Filled 2014-09-10: qty 6.7

## 2014-09-10 MED ORDER — GLYCOPYRROLATE 0.2 MG/ML IJ SOLN
INTRAMUSCULAR | Status: DC | PRN
  Administered 2014-09-10: 0.6 mg via INTRAVENOUS
  Administered 2014-09-10: 0.2 mg via INTRAVENOUS

## 2014-09-10 MED ORDER — BUPIVACAINE-EPINEPHRINE 0.25% -1:200000 IJ SOLN
INTRAMUSCULAR | Status: DC | PRN
  Administered 2014-09-10: 10 mL

## 2014-09-10 MED ORDER — HYDROMORPHONE HCL 1 MG/ML IJ SOLN
0.2500 mg | INTRAMUSCULAR | Status: DC | PRN
  Administered 2014-09-10 (×2): 0.5 mg via INTRAVENOUS

## 2014-09-10 MED ORDER — LIDOCAINE HCL (CARDIAC) 20 MG/ML IV SOLN
INTRAVENOUS | Status: AC
  Filled 2014-09-10: qty 5

## 2014-09-10 MED ORDER — SODIUM CHLORIDE 0.9 % IR SOLN
Status: DC | PRN
  Administered 2014-09-10: 1000 mL

## 2014-09-10 MED ORDER — HYDROMORPHONE HCL 1 MG/ML IJ SOLN
INTRAMUSCULAR | Status: AC
  Administered 2014-09-10: 0.5 mg via INTRAVENOUS
  Filled 2014-09-10: qty 1

## 2014-09-10 MED ORDER — OXYCODONE-ACETAMINOPHEN 5-325 MG PO TABS
ORAL_TABLET | ORAL | Status: AC
  Administered 2014-09-10: 2 via ORAL
  Filled 2014-09-10: qty 2

## 2014-09-10 MED ORDER — ONDANSETRON HCL 4 MG/2ML IJ SOLN
INTRAMUSCULAR | Status: AC
  Filled 2014-09-10: qty 2

## 2014-09-10 MED ORDER — ONDANSETRON HCL 4 MG/2ML IJ SOLN
INTRAMUSCULAR | Status: DC | PRN
  Administered 2014-09-10: 4 mg via INTRAVENOUS

## 2014-09-10 MED ORDER — NEOSTIGMINE METHYLSULFATE 10 MG/10ML IV SOLN
INTRAVENOUS | Status: DC | PRN
  Administered 2014-09-10: 4 mg via INTRAVENOUS

## 2014-09-10 MED ORDER — DEXAMETHASONE SODIUM PHOSPHATE 4 MG/ML IJ SOLN
INTRAMUSCULAR | Status: DC | PRN
  Administered 2014-09-10: 4 mg via INTRAVENOUS

## 2014-09-10 SURGICAL SUPPLY — 61 items
BIT DRILL 2.0 LNG QUCK RELEASE (BIT) ×1 IMPLANT
BIT DRILL 2.3 QUICK RELEASE (BIT) ×1 IMPLANT
BIT DRILL 2.8X5 QR DISP (BIT) ×3 IMPLANT
CHLORAPREP W/TINT 26ML (MISCELLANEOUS) ×3 IMPLANT
CLOSURE WOUND 1/2 X4 (GAUZE/BANDAGES/DRESSINGS) ×1
COVER SURGICAL LIGHT HANDLE (MISCELLANEOUS) ×3 IMPLANT
DRAPE C-ARM 42X72 X-RAY (DRAPES) ×3 IMPLANT
DRAPE IMP U-DRAPE 54X76 (DRAPES) ×3 IMPLANT
DRAPE INCISE IOBAN 66X45 STRL (DRAPES) ×3 IMPLANT
DRAPE SURG 17X23 STRL (DRAPES) ×3 IMPLANT
DRAPE U-SHAPE 47X51 STRL (DRAPES) ×3 IMPLANT
DRILL 2.0 LNG QUICK RELEASE (BIT) ×3
DRILL 2.3 QUICK RELEASE (BIT) ×3
DRSG MEPILEX BORDER 4X4 (GAUZE/BANDAGES/DRESSINGS) ×3 IMPLANT
ELECT REM PT RETURN 9FT ADLT (ELECTROSURGICAL)
ELECTRODE REM PT RTRN 9FT ADLT (ELECTROSURGICAL) IMPLANT
GLOVE BIO SURGEON STRL SZ7 (GLOVE) ×3 IMPLANT
GLOVE BIO SURGEON STRL SZ7.5 (GLOVE) ×3 IMPLANT
GLOVE BIOGEL PI IND STRL 7.0 (GLOVE) ×1 IMPLANT
GLOVE BIOGEL PI IND STRL 8 (GLOVE) ×1 IMPLANT
GLOVE BIOGEL PI INDICATOR 7.0 (GLOVE) ×2
GLOVE BIOGEL PI INDICATOR 8 (GLOVE) ×2
GOWN STRL REUS W/ TWL LRG LVL3 (GOWN DISPOSABLE) ×3 IMPLANT
GOWN STRL REUS W/ TWL XL LVL3 (GOWN DISPOSABLE) ×1 IMPLANT
GOWN STRL REUS W/TWL LRG LVL3 (GOWN DISPOSABLE) ×6
GOWN STRL REUS W/TWL XL LVL3 (GOWN DISPOSABLE) ×2
KIT BASIN OR (CUSTOM PROCEDURE TRAY) ×3 IMPLANT
KIT BIO-TENODESIS 3X8 DISP (MISCELLANEOUS)
KIT INSRT BABSR STRL DISP BTN (MISCELLANEOUS) IMPLANT
KIT ROOM TURNOVER OR (KITS) ×3 IMPLANT
MANIFOLD NEPTUNE WASTE (CANNULA) ×3 IMPLANT
NEEDLE HYPO 25GX1X1/2 BEV (NEEDLE) ×3 IMPLANT
NEEDLE SPNL 18GX3.5 QUINCKE PK (NEEDLE) IMPLANT
NS IRRIG 1000ML POUR BTL (IV SOLUTION) ×3 IMPLANT
PACK SHOULDER (CUSTOM PROCEDURE TRAY) ×3 IMPLANT
PACK UNIVERSAL I (CUSTOM PROCEDURE TRAY) ×3 IMPLANT
PAD ARMBOARD 7.5X6 YLW CONV (MISCELLANEOUS) ×6 IMPLANT
PLATE LEFT CLAVICLE 8H (Plate) ×3 IMPLANT
RETRIEVER SUT HEWSON (MISCELLANEOUS) IMPLANT
SCREW CORTICAL 2.3X14 (Screw) ×3 IMPLANT
SCREW HEXALOBE LOCKING 3.5X14M (Screw) ×3 IMPLANT
SCREW LOCK 12X3.5X HEXALOBE (Screw) ×1 IMPLANT
SCREW LOCKING 3.5X12 (Screw) ×2 IMPLANT
SCREW NON LOCKING 2.3X12MM (Screw) ×3 IMPLANT
SLING ARM IMMOBILIZER MED (SOFTGOODS) ×3 IMPLANT
SLING ARM LRG ADULT FOAM STRAP (SOFTGOODS) ×3 IMPLANT
SLING ARM MED ADULT FOAM STRAP (SOFTGOODS) IMPLANT
STRIP CLOSURE SKIN 1/2X4 (GAUZE/BANDAGES/DRESSINGS) ×2 IMPLANT
SUCTION FRAZIER TIP 10 FR DISP (SUCTIONS) ×3 IMPLANT
SUT FIBERWIRE #2 38 T-5 BLUE (SUTURE) ×3
SUT MON AB 4-0 PC3 18 (SUTURE) ×3 IMPLANT
SUT PDS AB 1 CT  36 (SUTURE)
SUT PDS AB 1 CT 36 (SUTURE) IMPLANT
SUT VIC AB 2-0 CT1 27 (SUTURE) ×2
SUT VIC AB 2-0 CT1 TAPERPNT 27 (SUTURE) ×1 IMPLANT
SUT VICRYL 0 CT 1 36IN (SUTURE) ×3 IMPLANT
SUTURE FIBERWR #2 38 T-5 BLUE (SUTURE) ×1 IMPLANT
SYR CONTROL 10ML LL (SYRINGE) IMPLANT
TOWEL OR 17X24 6PK STRL BLUE (TOWEL DISPOSABLE) ×3 IMPLANT
TOWEL OR 17X26 10 PK STRL BLUE (TOWEL DISPOSABLE) ×3 IMPLANT
WATER STERILE IRR 1000ML POUR (IV SOLUTION) IMPLANT
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
## 2014-09-10 NOTE — Anesthesia Preprocedure Evaluation (Addendum)
Anesthesia Evaluation  Patient identified by MRN, date of birth, ID band Patient awake    Reviewed: Allergy & Precautions, NPO status , Patient's Chart, lab work & pertinent test results  History of Anesthesia Complications (+) PONV and history of anesthetic complications  Airway Mallampati: II  TM Distance: >3 FB Neck ROM: Full    Dental no notable dental hx.    Pulmonary pneumonia -,  breath sounds clear to auscultation  Pulmonary exam normal       Cardiovascular hypertension, Pt. on medications Normal cardiovascular examRhythm:Regular Rate:Normal     Neuro/Psych PSYCHIATRIC DISORDERS Anxiety negative neurological ROS  negative psych ROS   GI/Hepatic negative GI ROS, Neg liver ROS,   Endo/Other  negative endocrine ROS  Renal/GU Renal disease     Musculoskeletal negative musculoskeletal ROS (+)   Abdominal   Peds  Hematology negative hematology ROS (+)   Anesthesia Other Findings   Reproductive/Obstetrics negative OB ROS                             Anesthesia Physical Anesthesia Plan  ASA: II  Anesthesia Plan: General   Post-op Pain Management:    Induction: Intravenous  Airway Management Planned: Oral ETT  Additional Equipment: None  Intra-op Plan:   Post-operative Plan: Extubation in OR  Informed Consent: I have reviewed the patients History and Physical, chart, labs and discussed the procedure including the risks, benefits and alternatives for the proposed anesthesia with the patient or authorized representative who has indicated his/her understanding and acceptance.   Dental advisory given  Plan Discussed with: CRNA  Anesthesia Plan Comments:         Anesthesia Quick Evaluation

## 2014-09-10 NOTE — Transfer of Care (Signed)
Immediate Anesthesia Transfer of Care Note  Patient: Keith Johnson  Procedure(s) Performed: Procedure(s) with comments: OPEN REDUCTION INTERNAL FIXATION (ORIF) CLAVICULAR FRACTURE (Left) - Left Open reduction internal fixatin clavical  Patient Location: PACU  Anesthesia Type:General  Level of Consciousness: awake and alert   Airway & Oxygen Therapy: Patient Spontanous Breathing and Patient connected to nasal cannula oxygen  Post-op Assessment: Report given to RN and Post -op Vital signs reviewed and stable  Post vital signs: Reviewed and stable  Last Vitals:  Filed Vitals:   09/10/14 1302  BP: 133/76  Pulse: 61  Temp: 36.7 C  Resp: 18    Complications: No apparent anesthesia complications
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
## 2014-09-10 NOTE — Discharge Instructions (Signed)
Discharge Instructions after Open Shoulder Repair  A sling has been provided for you. Remain in your sling at all times.  Use ice on the shoulder intermittently over the first 48 hours after surgery.  Pain medicine has been prescribed for you.  Use your medicine liberally over the first 48 hours, and then you can begin to taper your use. You may take Extra Strength Tylenol or Tylenol only in place of the pain pills. DO NOT take ANY nonsteroidal anti-inflammatory pain medications: Advil, Motrin, Ibuprofen, Aleve, Naproxen or Naprosyn.  You may remove your dressing after two days  You may shower 5 days after surgery. The incisions CANNOT get wet prior to 5 days. Simply allow the water to wash over the site and then pat dry. Do not rub the incisions. Make sure your axilla (armpit) is completely dry after showering.  Take one aspirin, a day for 2 weeks after surgery, unless you have an aspirin sensitivity/ allergy or asthma.   Please call [phone] during normal business hours or [phone] after hours for any problems. Including the following:  - excessive redness of the incisions - drainage for more than 4 days - fever of more than 101.5 F  *Please note that pain medications will not be refilled after hours or on weekends.

## 2014-09-10 NOTE — Anesthesia Procedure Notes (Signed)
Procedure Name: Intubation Performed by: Kizzie Fantasia Pre-anesthesia Checklist: Patient identified, Emergency Drugs available, Patient being monitored, Suction available and Timeout performed Patient Re-evaluated:Patient Re-evaluated prior to inductionOxygen Delivery Method: Circle system utilized Preoxygenation: Pre-oxygenation with 100% oxygen Intubation Type: IV induction Ventilation: Mask ventilation without difficulty Laryngoscope Size: Mac and 4 Grade View: Grade I Tube type: Oral Tube size: 7.5 mm Number of attempts: 1 Airway Equipment and Method: Stylet Placement Confirmation: ETT inserted through vocal cords under direct vision,  positive ETCO2,  CO2 detector and breath sounds checked- equal and bilateral Secured at: 23 cm Tube secured with: Tape Dental Injury: Teeth and Oropharynx as per pre-operative assessment

## 2014-09-11 ENCOUNTER — Encounter (HOSPITAL_COMMUNITY): Payer: Self-pay | Admitting: Orthopedic Surgery

## 2014-09-11 NOTE — Anesthesia Postprocedure Evaluation (Signed)
Anesthesia Post Note  Patient: Keith Johnson  Procedure(s) Performed: Procedure(s) (LRB): OPEN REDUCTION INTERNAL FIXATION (ORIF) CLAVICULAR FRACTURE (Left)  Anesthesia type: general  Patient location: PACU  Post pain: Pain level controlled  Post assessment: Patient's Cardiovascular Status Stable  Last Vitals:  Filed Vitals:   09/10/14 1900  BP:   Pulse: 84  Temp: 36.9 C  Resp: 20    Post vital signs: Reviewed and stable  Level of consciousness: sedated  Complications: No apparent anesthesia complications

## 2014-09-14 ENCOUNTER — Telehealth: Payer: Self-pay | Admitting: Family Medicine

## 2014-09-14 MED ORDER — AMPHETAMINE-DEXTROAMPHET ER 20 MG PO CP24: 20.0000 mg | ORAL_CAPSULE | Freq: Every day | ORAL | Status: DC

## 2014-09-14 NOTE — Telephone Encounter (Signed)
Med filled and placed at front desk for pt wife.

## 2014-09-14 NOTE — Telephone Encounter (Signed)
Caller name: Tahjai Amari Relationship to patient: wife Can be reached: [phone] Pharmacy:  Reason for call: Pt needing refill on Adderall. Has 5 pills left. Takes 1/day. Pharmacy would only fill 1 month RX last time. Please give 3 24-month RX's. Insurance is requiring 1 month at a time be filled by the pharmacy. Denny Peon has appt this Wed 09/16/14. She'd like to pick up RX when she comes in. Please notify Denny Peon.

## 2014-10-01 ENCOUNTER — Ambulatory Visit: Payer: 59 | Admitting: Family Medicine

## 2014-10-02 ENCOUNTER — Telehealth: Payer: Self-pay | Admitting: Family Medicine

## 2014-10-02 NOTE — Telephone Encounter (Signed)
[facility] or no charge[facility]

## 2014-10-02 NOTE — Telephone Encounter (Signed)
Yes- pt needs to be charged 

## 2014-10-14 ENCOUNTER — Encounter: Payer: Self-pay | Admitting: Family Medicine

## 2014-10-14 ENCOUNTER — Ambulatory Visit (INDEPENDENT_AMBULATORY_CARE_PROVIDER_SITE_OTHER): Payer: 59 | Admitting: Family Medicine

## 2014-10-14 VITALS — BP 112/78 | HR 73 | Temp 98.3°F | Ht 71.0 in | Wt 174.4 lb

## 2014-10-14 DIAGNOSIS — F411 Generalized anxiety disorder: Secondary | ICD-10-CM

## 2014-10-14 MED ORDER — FLUOXETINE HCL 40 MG PO CAPS
40.0000 mg | ORAL_CAPSULE | Freq: Every day | ORAL | Status: DC
Start: 2014-10-14 — End: 2014-12-16

## 2014-10-14 NOTE — Progress Notes (Signed)
   Subjective:    Patient ID: Keith Johnson, male    DOB: [birth year], 39 y.o.   MRN: [id number]  HPI Anxiety- new dx at last visit.  Started on Prozac.  Pt feels this is helping.  Wife feels sxs are improving.  Pt feels he would get added benefit from increased dose.  Reports sxs of feeling wound or overwhelmed have improved.  No palpitations.  No longer as quick to anger.   Review of Systems For ROS see HPI     Objective:   Physical Exam  Constitutional: He is oriented to person, place, and time. He appears well-developed and well-nourished. No distress.  HENT:  Head: Normocephalic and atraumatic.  Eyes: Conjunctivae and EOM are normal. Pupils are equal, round, and reactive to light.  Neck: Normal range of motion. Neck supple. No thyromegaly present.  Cardiovascular: Normal rate, regular rhythm, normal heart sounds and intact distal pulses.   Pulmonary/Chest: Effort normal and breath sounds normal. No respiratory distress. He has no wheezes. He has no rales.  Lymphadenopathy:    He has no cervical adenopathy.  Neurological: He is alert and oriented to person, place, and time.  Skin: Skin is warm and dry.  Psychiatric: He has a normal mood and affect. His behavior is normal. Thought content normal.  Vitals reviewed.         Assessment & Plan:

## 2014-10-14 NOTE — Assessment & Plan Note (Signed)
Improved since starting Prozac.  Pt feels that increasing his dose may be beneficial.  Will increase to  daily and monitor for improvement.  Pt expressed understanding and is in agreement w/ plan.

## 2014-10-14 NOTE — Patient Instructions (Signed)
Follow up in 6 months to recheck ADD and anxiety Increase the Prozac to - 2 of what you have at home and 1 of the new prescription Call with any questions or concerns Hang in there! Enjoy the rest of your summer!!

## 2014-10-14 NOTE — Progress Notes (Signed)
Pre visit review using our clinic review tool, if applicable. No additional management support is needed unless otherwise documented below in the visit note. 

## 2014-10-21 ENCOUNTER — Other Ambulatory Visit: Payer: Self-pay | Admitting: Orthopedic Surgery

## 2014-10-21 ENCOUNTER — Encounter (HOSPITAL_BASED_OUTPATIENT_CLINIC_OR_DEPARTMENT_OTHER): Payer: Self-pay | Admitting: *Deleted

## 2014-10-22 ENCOUNTER — Ambulatory Visit (HOSPITAL_BASED_OUTPATIENT_CLINIC_OR_DEPARTMENT_OTHER): Payer: 59 | Admitting: Anesthesiology

## 2014-10-22 ENCOUNTER — Ambulatory Visit (HOSPITAL_BASED_OUTPATIENT_CLINIC_OR_DEPARTMENT_OTHER)
Admission: RE | Admit: 2014-10-22 | Discharge: 2014-10-22 | Disposition: A | Discharge status: Home / Self Care | Payer: 59 | Source: Ambulatory Visit | Attending: Orthopedic Surgery | Admitting: Orthopedic Surgery

## 2014-10-22 ENCOUNTER — Ambulatory Visit (HOSPITAL_COMMUNITY): Payer: 59

## 2014-10-22 ENCOUNTER — Encounter (HOSPITAL_BASED_OUTPATIENT_CLINIC_OR_DEPARTMENT_OTHER): Payer: Self-pay | Admitting: *Deleted

## 2014-10-22 ENCOUNTER — Encounter (HOSPITAL_BASED_OUTPATIENT_CLINIC_OR_DEPARTMENT_OTHER)
Admission: RE | Disposition: A | Discharge status: Home / Self Care | Payer: Self-pay | Source: Ambulatory Visit | Attending: Orthopedic Surgery

## 2014-10-22 DIAGNOSIS — I10 Essential (primary) hypertension: Secondary | ICD-10-CM | POA: Insufficient documentation

## 2014-10-22 DIAGNOSIS — Z4789 Encounter for other orthopedic aftercare: Secondary | ICD-10-CM

## 2014-10-22 DIAGNOSIS — Z9119 Patient's noncompliance with other medical treatment and regimen: Secondary | ICD-10-CM | POA: Diagnosis not present

## 2014-10-22 DIAGNOSIS — F419 Anxiety disorder, unspecified: Secondary | ICD-10-CM | POA: Insufficient documentation

## 2014-10-22 DIAGNOSIS — Z87891 Personal history of nicotine dependence: Secondary | ICD-10-CM | POA: Insufficient documentation

## 2014-10-22 DIAGNOSIS — W1642XA Fall into unspecified water causing other injury, initial encounter: Secondary | ICD-10-CM | POA: Insufficient documentation

## 2014-10-22 DIAGNOSIS — E785 Hyperlipidemia, unspecified: Secondary | ICD-10-CM | POA: Insufficient documentation

## 2014-10-22 DIAGNOSIS — T84218A Breakdown (mechanical) of internal fixation device of other bones, initial encounter: Secondary | ICD-10-CM | POA: Insufficient documentation

## 2014-10-22 DIAGNOSIS — F909 Attention-deficit hyperactivity disorder, unspecified type: Secondary | ICD-10-CM | POA: Diagnosis not present

## 2014-10-22 HISTORY — PX: ORIF CLAVICULAR FRACTURE: SHX5055

## 2014-10-22 SURGERY — OPEN REDUCTION INTERNAL FIXATION (ORIF) CLAVICULAR FRACTURE
Anesthesia: General | Site: Shoulder | Laterality: Left

## 2014-10-22 MED ORDER — LACTATED RINGERS IV SOLN
INTRAVENOUS | Status: DC
  Administered 2014-10-22 (×2): via INTRAVENOUS

## 2014-10-22 MED ORDER — PROPOFOL 10 MG/ML IV BOLUS
INTRAVENOUS | Status: DC | PRN
  Administered 2014-10-22: 200 mg via INTRAVENOUS

## 2014-10-22 MED ORDER — FENTANYL CITRATE (PF) 100 MCG/2ML IJ SOLN
INTRAMUSCULAR | Status: AC
  Filled 2014-10-22: qty 6

## 2014-10-22 MED ORDER — BUPIVACAINE-EPINEPHRINE 0.25% -1:200000 IJ SOLN
INTRAMUSCULAR | Status: DC | PRN
  Administered 2014-10-22: 15 mL

## 2014-10-22 MED ORDER — DEXAMETHASONE SODIUM PHOSPHATE 4 MG/ML IJ SOLN
INTRAMUSCULAR | Status: DC | PRN
  Administered 2014-10-22: 10 mg via INTRAVENOUS

## 2014-10-22 MED ORDER — CEFAZOLIN SODIUM-DEXTROSE 2-3 GM-% IV SOLR
2.0000 g | INTRAVENOUS | Status: AC
  Administered 2014-10-22: 2 g via INTRAVENOUS

## 2014-10-22 MED ORDER — HYDROMORPHONE HCL 1 MG/ML IJ SOLN: 0.2500 mg | INTRAMUSCULAR | Status: DC | PRN

## 2014-10-22 MED ORDER — ONDANSETRON HCL 4 MG/2ML IJ SOLN
INTRAMUSCULAR | Status: DC | PRN
  Administered 2014-10-22: 4 mg via INTRAVENOUS

## 2014-10-22 MED ORDER — LIDOCAINE HCL (CARDIAC) 20 MG/ML IV SOLN
INTRAVENOUS | Status: DC | PRN
  Administered 2014-10-22: 50 mg via INTRAVENOUS

## 2014-10-22 MED ORDER — FENTANYL CITRATE (PF) 100 MCG/2ML IJ SOLN
50.0000 ug | INTRAMUSCULAR | Status: DC | PRN
  Administered 2014-10-22: 100 ug via INTRAVENOUS
  Administered 2014-10-22: 50 ug via INTRAVENOUS

## 2014-10-22 MED ORDER — MORPHINE SULFATE 10 MG/ML IJ SOLN
INTRAMUSCULAR | Status: AC
  Filled 2014-10-22: qty 1

## 2014-10-22 MED ORDER — HYDROCODONE-ACETAMINOPHEN 5-325 MG PO TABS: 1.0000 | ORAL_TABLET | ORAL | Status: DC | PRN

## 2014-10-22 MED ORDER — OXYCODONE HCL 5 MG/5ML PO SOLN: 5.0000 mg | Freq: Once | ORAL | Status: DC | PRN

## 2014-10-22 MED ORDER — POVIDONE-IODINE 7.5 % EX SOLN: Freq: Once | CUTANEOUS | Status: DC

## 2014-10-22 MED ORDER — MIDAZOLAM HCL 2 MG/2ML IJ SOLN
INTRAMUSCULAR | Status: AC
  Filled 2014-10-22: qty 2

## 2014-10-22 MED ORDER — MEPERIDINE HCL 25 MG/ML IJ SOLN: 6.2500 mg | INTRAMUSCULAR | Status: DC | PRN

## 2014-10-22 MED ORDER — SCOPOLAMINE 1 MG/3DAYS TD PT72
1.0000 | MEDICATED_PATCH | Freq: Once | TRANSDERMAL | Status: DC | PRN
  Administered 2014-10-22: 1.5 mg via TRANSDERMAL

## 2014-10-22 MED ORDER — SUCCINYLCHOLINE CHLORIDE 20 MG/ML IJ SOLN
INTRAMUSCULAR | Status: DC | PRN
  Administered 2014-10-22: 100 mg via INTRAVENOUS

## 2014-10-22 MED ORDER — SCOPOLAMINE 1 MG/3DAYS TD PT72
MEDICATED_PATCH | TRANSDERMAL | Status: AC
  Filled 2014-10-22: qty 1

## 2014-10-22 MED ORDER — CEFAZOLIN SODIUM-DEXTROSE 2-3 GM-% IV SOLR
INTRAVENOUS | Status: AC
  Filled 2014-10-22: qty 50

## 2014-10-22 MED ORDER — GLYCOPYRROLATE 0.2 MG/ML IJ SOLN: 0.2000 mg | Freq: Once | INTRAMUSCULAR | Status: DC | PRN

## 2014-10-22 MED ORDER — MIDAZOLAM HCL 2 MG/2ML IJ SOLN
1.0000 mg | INTRAMUSCULAR | Status: DC | PRN
  Administered 2014-10-22: 2 mg via INTRAVENOUS

## 2014-10-22 MED ORDER — OXYCODONE HCL 5 MG PO TABS: 5.0000 mg | ORAL_TABLET | Freq: Once | ORAL | Status: DC | PRN

## 2014-10-22 SURGICAL SUPPLY — 63 items
BIT DRILL 2.8X5 QR DISP (BIT) ×3 IMPLANT
BLADE CLIPPER SURG (BLADE) IMPLANT
BLADE SURG 15 STRL LF DISP TIS (BLADE) ×1 IMPLANT
BLADE SURG 15 STRL SS (BLADE) ×2
CHLORAPREP W/TINT 26ML (MISCELLANEOUS) ×3 IMPLANT
CLOSURE WOUND 1/2 X4 (GAUZE/BANDAGES/DRESSINGS) ×1
DECANTER SPIKE VIAL GLASS SM (MISCELLANEOUS) IMPLANT
DRAPE C-ARM 42X72 X-RAY (DRAPES) ×3 IMPLANT
DRAPE INCISE IOBAN 66X45 STRL (DRAPES) ×3 IMPLANT
DRAPE SURG 17X23 STRL (DRAPES) ×3 IMPLANT
DRAPE U 20/CS (DRAPES) ×3 IMPLANT
DRAPE U-SHAPE 47X51 STRL (DRAPES) ×3 IMPLANT
DRAPE U-SHAPE 76X120 STRL (DRAPES) ×6 IMPLANT
DRSG TEGADERM 4X4.75 (GAUZE/BANDAGES/DRESSINGS) ×6 IMPLANT
ELECT REM PT RETURN 9FT ADLT (ELECTROSURGICAL) ×3
ELECTRODE REM PT RTRN 9FT ADLT (ELECTROSURGICAL) ×1 IMPLANT
GAUZE SPONGE 4X4 12PLY STRL (GAUZE/BANDAGES/DRESSINGS) ×3 IMPLANT
GLOVE BIO SURGEON STRL SZ 6.5 (GLOVE) ×2 IMPLANT
GLOVE BIO SURGEON STRL SZ7 (GLOVE) ×6 IMPLANT
GLOVE BIO SURGEON STRL SZ7.5 (GLOVE) ×3 IMPLANT
GLOVE BIO SURGEONS STRL SZ 6.5 (GLOVE) ×1
GLOVE BIOGEL PI IND STRL 7.0 (GLOVE) ×3 IMPLANT
GLOVE BIOGEL PI IND STRL 8 (GLOVE) ×1 IMPLANT
GLOVE BIOGEL PI INDICATOR 7.0 (GLOVE) ×6
GLOVE BIOGEL PI INDICATOR 8 (GLOVE) ×2
GOWN STRL REUS W/ TWL LRG LVL3 (GOWN DISPOSABLE) ×3 IMPLANT
GOWN STRL REUS W/TWL LRG LVL3 (GOWN DISPOSABLE) ×6
NS IRRIG 1000ML POUR BTL (IV SOLUTION) ×3 IMPLANT
PACK ARTHROSCOPY DSU (CUSTOM PROCEDURE TRAY) ×3 IMPLANT
PACK BASIN DAY SURGERY FS (CUSTOM PROCEDURE TRAY) ×3 IMPLANT
PENCIL BUTTON HOLSTER BLD 10FT (ELECTRODE) ×3 IMPLANT
PLATE CLAVICLE 10 HOLE (Plate) ×3 IMPLANT
RETRIEVER SUT HEWSON (MISCELLANEOUS) IMPLANT
SCREW HEXALOBE LOCKING 3.5X14M (Screw) ×6 IMPLANT
SCREW HEXALOBE NON-LOCK 3.5X14 (Screw) ×3 IMPLANT
SCREW HEXALOBE NON-LOCK 3.5X16 (Screw) ×3 IMPLANT
SCREW LOCK 12X3.5X HEXALOBE (Screw) ×2 IMPLANT
SCREW LOCKING 3.5X12 (Screw) ×4 IMPLANT
SLEEVE SCD COMPRESS KNEE MED (MISCELLANEOUS) ×3 IMPLANT
SLING ARM IMMOBILIZER MED (SOFTGOODS) IMPLANT
SLING ARM LRG ADULT FOAM STRAP (SOFTGOODS) ×3 IMPLANT
SLING ARM MED ADULT FOAM STRAP (SOFTGOODS) IMPLANT
SLING ARM XL FOAM STRAP (SOFTGOODS) IMPLANT
SPONGE LAP 18X18 X RAY DECT (DISPOSABLE) ×3 IMPLANT
STRIP CLOSURE SKIN 1/2X4 (GAUZE/BANDAGES/DRESSINGS) ×2 IMPLANT
SUCTION FRAZIER TIP 10 FR DISP (SUCTIONS) ×6 IMPLANT
SUPPORT WRAP ARM LG (MISCELLANEOUS) IMPLANT
SUT ETHILON 4 0 PS 2 18 (SUTURE) IMPLANT
SUT FIBERWIRE #2 38 T-5 BLUE (SUTURE) ×3
SUT MNCRL AB 4-0 PS2 18 (SUTURE) ×6 IMPLANT
SUT TIGER TAPE 7 IN WHITE (SUTURE) IMPLANT
SUT VIC AB 0 CT1 27 (SUTURE) ×2
SUT VIC AB 0 CT1 27XBRD ANBCTR (SUTURE) ×1 IMPLANT
SUT VIC AB 2-0 SH 27 (SUTURE) ×2
SUT VIC AB 2-0 SH 27XBRD (SUTURE) ×1 IMPLANT
SUTURE FIBERWR #2 38 T-5 BLUE (SUTURE) ×1 IMPLANT
SYR BULB 3OZ (MISCELLANEOUS) ×3 IMPLANT
TAPE FIBER 2MM 7IN #2 BLUE (SUTURE) IMPLANT
TOWEL OR 17X24 6PK STRL BLUE (TOWEL DISPOSABLE) ×3 IMPLANT
TOWEL OR NON WOVEN STRL DISP B (DISPOSABLE) ×3 IMPLANT
TUBE CONNECTING 20'X1/4 (TUBING) ×1
TUBE CONNECTING 20X1/4 (TUBING) ×2 IMPLANT
YANKAUER SUCT BULB TIP NO VENT (SUCTIONS) ×3 IMPLANT

## 2014-10-22 NOTE — Transfer of Care (Signed)
Immediate Anesthesia Transfer of Care Note  Patient: Keith Johnson  Procedure(s) Performed: Procedure(s) with comments: OPEN REDUCTION INTERNAL FIXATION (ORIF) CLAVICULAR FRACTURE, HARDWARE REMOVAL  (Left) - Open reduction internal fixation left clavical fracture with left clavical hardware removal  Patient Location: PACU  Anesthesia Type:General  Level of Consciousness: awake  Airway & Oxygen Therapy: Patient Spontanous Breathing and Patient connected to face mask oxygen  Post-op Assessment: Report given to RN and Post -op Vital signs reviewed and stable  Post vital signs: Reviewed and stable  Last Vitals:  Filed Vitals:   10/22/14 0952  BP: 141/83  Pulse: 56  Temp: 36.8 C  Resp: 16    Complications: No apparent anesthesia complications

## 2014-10-22 NOTE — H&P (Signed)
Keith Johnson is an 39 y.o. male.   Chief Complaint: Failed L clavicle ORIF HPI: 6 wks s/p ORIF L clavicle.  [place] and felt something shift.  Repeat XRs showed failure of fixation.  Past Medical History  Diagnosis Date  . ADD (attention deficit disorder)   . Hyperlipidemia   . Hypertension   . Pneumonia   . Anxiety   . Kidney stone     per pt experience  . Complication of anesthesia   . PONV (postoperative nausea and vomiting)     Past Surgical History  Procedure Laterality Date  . Orthopedic surgery      right clavicle  . Orif clavicular fracture Left 09/10/2014    Procedure: OPEN REDUCTION INTERNAL FIXATION (ORIF) CLAVICULAR FRACTURE;  Surgeon: Jones Broom, MD;  Location: [facility] OR;  Service: Orthopedics;  Laterality: Left;  Left Open reduction internal fixatin clavical    Family History  Problem Relation Age of Onset  . Stroke Mother   . Heart attack Maternal Grandfather   . Heart attack Paternal Grandmother   . Stroke Paternal Grandmother   . Fibromyalgia Father    Social History:  reports that he has quit smoking. His smoking use included Cigarettes. He has never used smokeless tobacco. He reports that he drinks alcohol. He reports that he does not use illicit drugs.  Allergies:  Allergies  Allergen Reactions  . Yellow Jacket Venom [Bee Venom] Swelling  . Percocet [Oxycodone-Acetaminophen] Nausea Only    Medications Prior to Admission  Medication Sig Dispense Refill  . amphetamine-dextroamphetamine (ADDERALL XR) 20 MG 24 hr capsule Take 1 capsule (20 mg total) by mouth daily. 30 capsule 0  . fenofibrate 160 MG tablet TAKE 1 TABLET BY MOUTH ONCE DAILY (Patient taking differently: Take 160 mg by mouth daily. ) 90 tablet 1  . FLUoxetine (PROZAC) 40 MG capsule Take 1 capsule (40 mg total) by mouth daily. 30 capsule 6  . valsartan (DIOVAN) 80 MG tablet TAKE 1 TABLET BY MOUTH DAILY 30 tablet 6  . amphetamine-dextroamphetamine (ADDERALL XR) 20 MG  24 hr capsule Take 1 capsule (20 mg total) by mouth daily. (Patient not taking: Reported on 10/14/2014) 30 capsule 0  . oxyCODONE-acetaminophen (ROXICET) 5-325 MG per tablet Take 1-2 tablets by mouth every 4 (four) hours as needed for severe pain. 60 tablet 0    No results found for this or any previous visit (from the past 48 hour(s)). No results found.  Review of Systems  All other systems reviewed and are negative.   Blood pressure 141/83, pulse 56, temperature 98.2 F (36.8 C), temperature source Oral, resp. rate 16, height  (1.803 m), weight 78.586 kg (173 lb 4 oz), SpO2 100 %. Physical Exam  Constitutional: He is oriented to person, place, and time. He appears well-developed and well-nourished.  HENT:  Head: Atraumatic.  Eyes: EOM are normal.  Cardiovascular: Intact distal pulses.   Respiratory: Effort normal.  Musculoskeletal:  L shoulder incision healed.  Prominent hardware centrally  Neurological: He is alert and oriented to person, place, and time.  Skin: Skin is warm and dry.  Psychiatric: He has a normal mood and affect.     Assessment/Plan L clavicle failure of fixation due to noncompliance Plan revision ORIF L clavicle fracture Risks / benefits of surgery discussed Consent on chart  NPO for OR Preop antibiotics   Keith Johnson 10/22/2014, 10:56 AM

## 2014-10-22 NOTE — Discharge Instructions (Signed)
Discharge Instructions after Open Shoulder Repair  A sling has been provided for you. Remain in your sling at all times. Use ice on the shoulder intermittently over the first 48 hours after surgery.  Pain medicine has been prescribed for you.  Use your medicine liberally over the first 48 hours, and then you can begin to taper your use. You may take Extra Strength Tylenol or Tylenol only in place of the pain pills. DO NOT take ANY nonsteroidal anti-inflammatory pain medications: Advil, Motrin, Ibuprofen, Aleve, Naproxen or Naprosyn.  You may remove your dressing after two days  You may shower 5 days after surgery. The incisions CANNOT get wet prior to 5 days. Simply allow the water to wash over the site and then pat dry. Do not rub the incisions. Make sure your axilla (armpit) is completely dry after showering.  Take one aspirin, a day for 2 weeks after surgery, unless you have an aspirin sensitivity/ allergy or asthma.   Please call [phone] during normal business hours or [phone] after hours for any problems. Including the following:  - excessive redness of the incisions - drainage for more than 4 days - fever of more than 101.5 F  *Please note that pain medications will not be refilled after hours or on weekends.   Post Anesthesia Home Care Instructions  Activity: Get plenty of rest for the remainder of the day. A responsible adult should stay with you for 24 hours following the procedure.  For the next 24 hours, DO NOT: -Drive a car -Advertising copywriter -Drink alcoholic beverages -Take any medication unless instructed by your physician -Make any legal decisions or sign important papers.  Meals: Start with liquid foods such as gelatin or soup. Progress to regular foods as tolerated. Avoid greasy, spicy, heavy foods. If nausea and/or vomiting occur, drink only clear liquids until the nausea and/or vomiting subsides. Call your physician if vomiting continues.  Special  Instructions/Symptoms: Your throat may feel dry or sore from the anesthesia or the breathing tube placed in your throat during surgery. If this causes discomfort, gargle with warm salt water. The discomfort should disappear within 24 hours.  If you had a scopolamine patch placed behind your ear for the management of post- operative nausea and/or vomiting:  1. The medication in the patch is effective for 72 hours, after which it should be removed.  Wrap patch in a tissue and discard in the trash. Wash hands thoroughly with soap and water. 2. You may remove the patch earlier than 72 hours if you experience unpleasant side effects which may include dry mouth, dizziness or visual disturbances. 3. Avoid touching the patch. Wash your hands with soap and water after contact with the patch.

## 2014-10-22 NOTE — Op Note (Signed)
Procedure(s): OPEN REDUCTION INTERNAL FIXATION (ORIF) CLAVICULAR FRACTURE, HARDWARE REMOVAL  Procedure Note  Keith Johnson male 39 y.o. 10/22/2014  Procedure(s) and Anesthesia Type: #1 deep hardware removal left clavicle #2 Revision open reduction internal fixation left clavicle fracture  Surgeon(s) and Role:    * Jones Broom, MD - Primary   Indications:  39 y.o. male s/p ORIF left clavicle fracture 6 weeks ago. At 3 weeks from surgery he dove [place] a [place] and felt a pop in his left shoulder. He presented yesterday to the office were repeat x-ray showed failure of fixation.  We discussed treatment options to include allowing the fracture to heal in its current configuration, versus revision fixation. He elected to go forward with revision fixation.  Surgeon: [name]   Assistants: Damita Lack PA-C [facility] was present and scrubbed throughout the procedure and was essential in positioning, retraction, exposure, and closure)  Anesthesia: General endotracheal anesthesia    Procedure Detail  OPEN REDUCTION INTERNAL FIXATION (ORIF) CLAVICULAR FRACTURE, HARDWARE REMOVAL   Findings: Anatomic reduction of the fracture with a 10 hole AccuMed plate with 6 cortices medial and 6 cortices lateral to the fracture.Locking and nonlocking screws were used. One of the interfragmentary screws was removed, but the other remain in place and all other screws in the plate were removed. Estimated Blood Loss:  less than 50 mL         Drains: none  Blood Given: none         Specimens: none        Complications:  * No complications entered in OR log *         Disposition: PACU - hemodynamically stable.         Condition: stable    Procedure:  DESCRIPTION OF PROCEDURE: The patient was identified in preoperative  holding area where I personally marked the operative site after  verifying site, side, and procedure with the patient. The patient was taken back  to the  operating room where general anesthesia was induced without  complication and was placed in the beach-chair position with the back  elevated about 40 degrees and all extremities carefully padded and  positioned. The neck was turned very slightly away from the operative field  to assist in exposure. The left upper extremity was then prepped and  draped in a standard sterile fashion. The appropriate time-out  procedure was carried out. The patient did receive IV antibiotics  within 30 minutes of incision.  The previously marked incision was incised and extended about 3 cm additionally medially. Dissection was carried down through subcutaneous tissues and medial and lateral skin flaps were elevated.  The deltotrapezial fascia was then opened over the clavicle and The plate was identified and exposed. All superior screws were removed. The more medial interfragmentary screw was identified and removed. The more lateral one was left in place. The displaced fracture portion was debrided of hematoma and early callus and then compressed with a fracture clamp. A 10 hole plate was then laid dorsally over the clavicle with additional exposure medially. This was felt to appropriately's and the area of injury with appropriate fixation medially and laterally. The plate was positioned to avoid previous screw holes. The position was verified with fluoroscopic imaging area reduction was noted to be anatomic. It was then fixed in place with a medial and lateral nonlocking screw to bring the plate to the bone and in the remainder of the screws were filled with locking screws.  Flouroscopic imaging  demonstrated appropriate position and screw lengths with anatomic reduction of the fracture.  The wound was copiously irrigated with normal saline and the deltotrapezial fascia was  then carefully closed over the construct with #0 vicryl sutures in  interrupted fashion. The skin was then closed with 2-0 Vicryl in a deep  dermal  layer, 4-0 Monocryl for skin closure. Steri-Strips were applied.  10 mL of 0.25% Marcaine with epinephrine were infiltrated for  postoperative pain. Sterile dressings were applied including a medium  Mepilex dressing. The patient was then allowed to awaken from general  anesthesia, placed in a sling, transferred to stretcher and taken to the  recovery room in stable condition.   POSTOPERATIVE PLAN: He will be Discharged home with his family today. He will follow-up in 2 weeks for wound check. We will keep him a sling for 6 weeks postoperatively.

## 2014-10-22 NOTE — Anesthesia Procedure Notes (Signed)
Procedure Name: Intubation Date/Time: 10/22/2014 11:43 AM Performed by: Caren Macadam Pre-anesthesia Checklist: Patient identified, Emergency Drugs available, Suction available and Patient being monitored Patient Re-evaluated:Patient Re-evaluated prior to inductionOxygen Delivery Method: Circle System Utilized Preoxygenation: Pre-oxygenation with 100% oxygen Intubation Type: IV induction Ventilation: Mask ventilation without difficulty Laryngoscope Size: Miller and 2 Grade View: Grade I Tube type: Oral Tube size: 7.0 mm Number of attempts: 1 Airway Equipment and Method: Stylet and Oral airway Placement Confirmation: ETT inserted through vocal cords under direct vision,  positive ETCO2 and breath sounds checked- equal and bilateral Secured at: 22 cm Tube secured with: Tape Dental Injury: Teeth and Oropharynx as per pre-operative assessment

## 2014-10-22 NOTE — Anesthesia Preprocedure Evaluation (Signed)
Anesthesia Evaluation  Patient identified by MRN, date of birth, ID band Patient awake    Reviewed: Allergy & Precautions, NPO status , Patient's Chart, lab work & pertinent test results  History of Anesthesia Complications (+) PONV  Airway Mallampati: I  TM Distance: >3 FB Neck ROM: Full    Dental  (+) Teeth Intact, Dental Advisory Given   Pulmonary former smoker,  breath sounds clear to auscultation        Cardiovascular hypertension, Pt. on medications Rhythm:Regular Rate:Normal     Neuro/Psych    GI/Hepatic   Endo/Other    Renal/GU      Musculoskeletal   Abdominal   Peds  Hematology   Anesthesia Other Findings   Reproductive/Obstetrics                             Anesthesia Physical Anesthesia Plan  ASA: II  Anesthesia Plan: General   Post-op Pain Management:    Induction: Intravenous  Airway Management Planned: Oral ETT  Additional Equipment:   Intra-op Plan:   Post-operative Plan: Extubation in OR  Informed Consent: I have reviewed the patients History and Physical, chart, labs and discussed the procedure including the risks, benefits and alternatives for the proposed anesthesia with the patient or authorized representative who has indicated his/her understanding and acceptance.   Dental advisory given  Plan Discussed with: CRNA, Anesthesiologist and Surgeon  Anesthesia Plan Comments:         Anesthesia Quick Evaluation

## 2014-10-23 ENCOUNTER — Encounter (HOSPITAL_BASED_OUTPATIENT_CLINIC_OR_DEPARTMENT_OTHER): Payer: Self-pay | Admitting: Orthopedic Surgery

## 2014-10-23 NOTE — Anesthesia Postprocedure Evaluation (Signed)
  Anesthesia Post-op Note  Patient: Keith Johnson  Procedure(s) Performed: Procedure(s) with comments: OPEN REDUCTION INTERNAL FIXATION (ORIF) CLAVICULAR FRACTURE, HARDWARE REMOVAL  (Left) - Open reduction internal fixation left clavical fracture with left clavical hardware removal  Patient Location: PACU  Anesthesia Type: General   Level of Consciousness: awake, alert  and oriented  Airway and Oxygen Therapy: Patient Spontanous Breathing  Post-op Pain: none  Post-op Assessment: Post-op Vital signs reviewed  Post-op Vital Signs: Reviewed  Last Vitals:  Filed Vitals:   10/22/14 1439  BP: 116/82  Pulse: 82  Temp: 36.5 C  Resp: 16    Complications: No apparent anesthesia complications

## 2014-12-10 ENCOUNTER — Inpatient Hospital Stay (HOSPITAL_COMMUNITY)
Admission: AD | Admit: 2014-12-10 | Discharge: 2014-12-16 | DRG: 885 | Disposition: A | Discharge status: Home / Self Care | Payer: 59 | Source: Intra-hospital | Attending: Psychiatry | Admitting: Psychiatry

## 2014-12-10 ENCOUNTER — Emergency Department (HOSPITAL_COMMUNITY)
Admission: EM | Admit: 2014-12-10 | Discharge: 2014-12-10 | Disposition: A | Discharge status: Other Acute Inpatient Hospital | Payer: 59 | Attending: Emergency Medicine | Admitting: Emergency Medicine

## 2014-12-10 ENCOUNTER — Encounter (HOSPITAL_COMMUNITY): Payer: Self-pay | Admitting: Nurse Practitioner

## 2014-12-10 ENCOUNTER — Encounter (HOSPITAL_COMMUNITY): Payer: Self-pay | Admitting: *Deleted

## 2014-12-10 DIAGNOSIS — X788XXA Intentional self-harm by other sharp object, initial encounter: Secondary | ICD-10-CM | POA: Insufficient documentation

## 2014-12-10 DIAGNOSIS — F909 Attention-deficit hyperactivity disorder, unspecified type: Secondary | ICD-10-CM | POA: Insufficient documentation

## 2014-12-10 DIAGNOSIS — Z8639 Personal history of other endocrine, nutritional and metabolic disease: Secondary | ICD-10-CM | POA: Diagnosis not present

## 2014-12-10 DIAGNOSIS — IMO0002 Reserved for concepts with insufficient information to code with codable children: Secondary | ICD-10-CM

## 2014-12-10 DIAGNOSIS — Z8701 Personal history of pneumonia (recurrent): Secondary | ICD-10-CM | POA: Diagnosis not present

## 2014-12-10 DIAGNOSIS — F419 Anxiety disorder, unspecified: Secondary | ICD-10-CM | POA: Insufficient documentation

## 2014-12-10 DIAGNOSIS — F332 Major depressive disorder, recurrent severe without psychotic features: Principal | ICD-10-CM | POA: Diagnosis present

## 2014-12-10 DIAGNOSIS — R45851 Suicidal ideations: Secondary | ICD-10-CM

## 2014-12-10 DIAGNOSIS — E781 Pure hyperglyceridemia: Secondary | ICD-10-CM | POA: Diagnosis present

## 2014-12-10 DIAGNOSIS — Y998 Other external cause status: Secondary | ICD-10-CM | POA: Diagnosis not present

## 2014-12-10 DIAGNOSIS — S61512A Laceration without foreign body of left wrist, initial encounter: Secondary | ICD-10-CM | POA: Diagnosis not present

## 2014-12-10 DIAGNOSIS — Z79899 Other long term (current) drug therapy: Secondary | ICD-10-CM | POA: Diagnosis not present

## 2014-12-10 DIAGNOSIS — F410 Panic disorder [episodic paroxysmal anxiety] without agoraphobia: Secondary | ICD-10-CM | POA: Diagnosis present

## 2014-12-10 DIAGNOSIS — T1491XA Suicide attempt, initial encounter: Secondary | ICD-10-CM | POA: Diagnosis present

## 2014-12-10 DIAGNOSIS — F1510 Other stimulant abuse, uncomplicated: Secondary | ICD-10-CM | POA: Diagnosis not present

## 2014-12-10 DIAGNOSIS — G4700 Insomnia, unspecified: Secondary | ICD-10-CM | POA: Diagnosis present

## 2014-12-10 DIAGNOSIS — Z87442 Personal history of urinary calculi: Secondary | ICD-10-CM | POA: Insufficient documentation

## 2014-12-10 DIAGNOSIS — I10 Essential (primary) hypertension: Secondary | ICD-10-CM | POA: Diagnosis present

## 2014-12-10 DIAGNOSIS — Y9389 Activity, other specified: Secondary | ICD-10-CM | POA: Diagnosis not present

## 2014-12-10 DIAGNOSIS — Z23 Encounter for immunization: Secondary | ICD-10-CM | POA: Diagnosis not present

## 2014-12-10 DIAGNOSIS — Y9289 Other specified places as the place of occurrence of the external cause: Secondary | ICD-10-CM | POA: Insufficient documentation

## 2014-12-10 DIAGNOSIS — Z8249 Family history of ischemic heart disease and other diseases of the circulatory system: Secondary | ICD-10-CM

## 2014-12-10 DIAGNOSIS — Z87891 Personal history of nicotine dependence: Secondary | ICD-10-CM | POA: Insufficient documentation

## 2014-12-10 DIAGNOSIS — S61511A Laceration without foreign body of right wrist, initial encounter: Secondary | ICD-10-CM | POA: Diagnosis not present

## 2014-12-10 DIAGNOSIS — Z823 Family history of stroke: Secondary | ICD-10-CM

## 2014-12-10 DIAGNOSIS — F329 Major depressive disorder, single episode, unspecified: Secondary | ICD-10-CM | POA: Diagnosis not present

## 2014-12-10 HISTORY — DX: Calculus of kidney: N20.0

## 2014-12-10 LAB — RAPID URINE DRUG SCREEN, HOSP PERFORMED
AMPHETAMINES: POSITIVE — AB
BARBITURATES: NOT DETECTED
BENZODIAZEPINES: NOT DETECTED
COCAINE: NOT DETECTED
Opiates: NOT DETECTED
Tetrahydrocannabinol: NOT DETECTED

## 2014-12-10 LAB — COMPREHENSIVE METABOLIC PANEL
ALT: 15 U/L — ABNORMAL LOW (ref 17–63)
AST: 21 U/L (ref 15–41)
Albumin: 4.4 g/dL (ref 3.5–5.0)
Alkaline Phosphatase: 48 U/L (ref 38–126)
Anion gap: 8 (ref 5–15)
BUN: 10 mg/dL (ref 6–20)
CHLORIDE: 106 mmol/L (ref 101–111)
CO2: 24 mmol/L (ref 22–32)
Calcium: 9.2 mg/dL (ref 8.9–10.3)
Creatinine, Ser: 0.95 mg/dL (ref 0.61–1.24)
GFR calc Af Amer: >60 mL/min (ref >60)
GFR calc non Af Amer: >60 mL/min (ref >60)
GLUCOSE: 102 mg/dL — AB (ref 65–99)
POTASSIUM: 3.8 mmol/L (ref 3.5–5.1)
SODIUM: 138 mmol/L (ref 135–145)
Total Bilirubin: 0.5 mg/dL (ref 0.3–1.2)
Total Protein: 7.5 g/dL (ref 6.5–8.1)

## 2014-12-10 LAB — CBC
HEMATOCRIT: 38.9 % — AB (ref 39.0–52.0)
Hemoglobin: 12.7 g/dL — ABNORMAL LOW (ref 13.0–17.0)
MCH: 30.7 pg (ref 26.0–34.0)
MCHC: 32.6 g/dL (ref 30.0–36.0)
MCV: 94 fL (ref 78.0–100.0)
PLATELETS: 279 10*3/uL (ref 150–400)
RBC: 4.14 MIL/uL — AB (ref 4.22–5.81)
RDW: 13.6 % (ref 11.5–15.5)
WBC: 5.1 10*3/uL (ref 4.0–10.5)

## 2014-12-10 LAB — ACETAMINOPHEN LEVEL: Acetaminophen (Tylenol), Serum: <10 ug/mL — ABNORMAL LOW (ref 10–30)

## 2014-12-10 LAB — SALICYLATE LEVEL: Salicylate Lvl: <4 mg/dL (ref 2.8–30.0)

## 2014-12-10 LAB — ETHANOL: Alcohol, Ethyl (B): <5 mg/dL (ref <5)

## 2014-12-10 MED ORDER — LORAZEPAM 1 MG PO TABS: 0.0000 mg | ORAL_TABLET | Freq: Four times a day (QID) | ORAL | Status: DC

## 2014-12-10 MED ORDER — IBUPROFEN 200 MG PO TABS: 600.0000 mg | ORAL_TABLET | Freq: Three times a day (TID) | ORAL | Status: DC | PRN

## 2014-12-10 MED ORDER — FLUOXETINE HCL 20 MG PO CAPS
40.0000 mg | ORAL_CAPSULE | Freq: Every day | ORAL | Status: DC
  Administered 2014-12-10: 40 mg via ORAL
  Filled 2014-12-10: qty 2

## 2014-12-10 MED ORDER — THIAMINE HCL 100 MG/ML IJ SOLN: 100.0000 mg | Freq: Every day | INTRAMUSCULAR | Status: DC

## 2014-12-10 MED ORDER — LORAZEPAM 1 MG PO TABS: 1.0000 mg | ORAL_TABLET | Freq: Three times a day (TID) | ORAL | Status: DC | PRN

## 2014-12-10 MED ORDER — BACITRACIN ZINC 500 UNIT/GM EX OINT
TOPICAL_OINTMENT | Freq: Two times a day (BID) | CUTANEOUS | Status: DC
  Administered 2014-12-10: 1 via TOPICAL
  Filled 2014-12-10: qty 0.9

## 2014-12-10 MED ORDER — VITAMIN B-1 100 MG PO TABS
100.0000 mg | ORAL_TABLET | Freq: Every day | ORAL | Status: DC
  Administered 2014-12-10: 100 mg via ORAL
  Filled 2014-12-10: qty 1

## 2014-12-10 MED ORDER — AMPHETAMINE-DEXTROAMPHET ER 10 MG PO CP24
20.0000 mg | ORAL_CAPSULE | Freq: Every day | ORAL | Status: DC
  Administered 2014-12-10: 20 mg via ORAL
  Filled 2014-12-10: qty 2

## 2014-12-10 MED ORDER — TETANUS-DIPHTH-ACELL PERTUSSIS 5-2.5-18.5 LF-MCG/0.5 IM SUSP
0.5000 mL | Freq: Once | INTRAMUSCULAR | Status: AC
  Administered 2014-12-10: 0.5 mL via INTRAMUSCULAR
  Filled 2014-12-10: qty 0.5

## 2014-12-10 MED ORDER — MAGNESIUM HYDROXIDE 400 MG/5ML PO SUSP
30.0000 mL | Freq: Once | ORAL | Status: DC
  Filled 2014-12-10: qty 30

## 2014-12-10 MED ORDER — ONDANSETRON HCL 4 MG PO TABS: 4.0000 mg | ORAL_TABLET | Freq: Three times a day (TID) | ORAL | Status: DC | PRN

## 2014-12-10 MED ORDER — LORAZEPAM 1 MG PO TABS: 0.0000 mg | ORAL_TABLET | Freq: Two times a day (BID) | ORAL | Status: DC

## 2014-12-10 NOTE — Progress Notes (Signed)
Pt admitted to [facility] as ordered for continuation of care. Pt ambulatory with a steady gait. Presents with flat / sad affect and depressed mood. Pt denied SI, HI, AVH and pain on initial contact, provided answers mostly by head node "yes or no". Per nursing report pt was brought to [facility] ED via ambulance after cutting both wrists in a suicide attempt related to increased depression since separation from wife of 10 years. Steri-stripes intact to bilateral wrist, site clean, dry and intact. Emotional support and availability offered. Scheduled medications administered as ordered. Q 15 minutes checks maintained for safety. Pt receptive to care and is cooperative with unit routines thus far. No gestures or event of self injurious behavior to note at this time.

## 2014-12-10 NOTE — Progress Notes (Signed)
Keith Johnson has been assessed and admitted to the OBS unit. He denies SI/HI/AVH. Contracts for safety. Endorsing depression and anxiety 2-3/10. Will continue to monitor for safety at this time.

## 2014-12-10 NOTE — ED Notes (Signed)
Pt alert and oriented x4. Respirations even and unlabored, bilateral symmetrical rise and fall of chest. Skin warm and dry. In no acute distress. Denies needs.   
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
## 2014-12-10 NOTE — BH Assessment (Signed)
Writer informed TTS Brandi of the consult.  

## 2014-12-10 NOTE — ED Notes (Signed)
Sitter at bedside.

## 2014-12-10 NOTE — BH Assessment (Signed)
[facility] Assessment Progress Note   Pt has been accepted to OBS bed #1, per [facility].  Patient can come on over per [facility].

## 2014-12-10 NOTE — ED Notes (Signed)
Bed: NW29 Expected date:  Expected time:  Means of arrival:  Comments: EMS- 39yo M, SI/wrist lacerations

## 2014-12-10 NOTE — ED Notes (Signed)
1 labeled, pt belonging bag at nursing station near room 25: shoes, shirt, shorts.

## 2014-12-10 NOTE — BH Assessment (Addendum)
Tele Assessment Note   Keith Johnson is an 39 y.o. male. Pt reports SI. Pt denies HI. Pt denies ABH. Pt appeared to have cut both of his wrists. Pt's wrists are currently bandaged. Pt contacted his wife once he cut his wrists. Pt states that he was recently separated from his wife and that has caused severe depression. Pt reports tearfulness, fatigue, isolating, depressed mood most of the day, and self-pity. Pt reports 1 previous SI attempt. Pt denies previous or current outpatient or inpatient treatment. According to the Pt, he does not believe in therapy. Pt states that he is prescribed Adderall and Prozac. Pt states that Dr. Ardeen Jourdain prescribes his medication. Pt reports occasional alcohol use. Pt denies previous or current abuse.   Per Julieanne Cotton, NP Pt meets inpatient criteria. TTS to seek placement.   Axis I: Major Depression, single episode Axis II: Deferred Axis III:  Past Medical History  Diagnosis Date  . ADD (attention deficit disorder)   . Hyperlipidemia   . Hypertension   . Pneumonia   . Anxiety   . Kidney stone     per pt experience  . Complication of anesthesia   . PONV (postoperative nausea and vomiting)    Axis IV: other psychosocial or environmental problems, problems related to social environment and problems with primary support group Axis V: 31-40 impairment in reality testing  Past Medical History:  Past Medical History  Diagnosis Date  . ADD (attention deficit disorder)   . Hyperlipidemia   . Hypertension   . Pneumonia   . Anxiety   . Kidney stone     per pt experience  . Complication of anesthesia   . PONV (postoperative nausea and vomiting)     Past Surgical History  Procedure Laterality Date  . Orthopedic surgery      right clavicle  . Orif clavicular fracture Left 09/10/2014    Procedure: OPEN REDUCTION INTERNAL FIXATION (ORIF) CLAVICULAR FRACTURE;  Surgeon: Jones Broom, MD;  Location: [facility] OR;  Service: Orthopedics;  Laterality: Left;  Left Open  reduction internal fixatin clavical  . Orif clavicular fracture Left 10/22/2014    Procedure: OPEN REDUCTION INTERNAL FIXATION (ORIF) CLAVICULAR FRACTURE, HARDWARE REMOVAL ;  Surgeon: Jones Broom, MD;  Location: [facility];  Service: Orthopedics;  Laterality: Left;  Open reduction internal fixation left clavical fracture with left clavical hardware removal    Family History:  Family History  Problem Relation Age of Onset  . Stroke Mother   . Heart attack Maternal Grandfather   . Heart attack Paternal Grandmother   . Stroke Paternal Grandmother   . Fibromyalgia Father     Social History:  reports that he has quit smoking. His smoking use included Cigarettes. He has never used smokeless tobacco. He reports that he drinks alcohol. He reports that he does not use illicit drugs.  Additional Social History:  Alcohol / Drug Use Pain Medications: Pt denies  Prescriptions: Prozac, Adderall Over the Counter: Pt denies History of alcohol / drug use?: Yes Longest period of sobriety (when/how long): NA Substance #1 Name of Substance 1: Alcohol 1 - Age of First Use: 20 1 - Amount (size/oz): Unknown 1 - Frequency: Occasional 1 - Duration: ongoing 1 - Last Use / Amount: Unknown  CIWA: CIWA-Ar BP: 151/86 mmHg Pulse Rate: 67 COWS:    PATIENT STRENGTHS: (choose at least two) Capable of independent living Communication skills  Allergies:  Allergies  Allergen Reactions  . Yellow Jacket Venom [Bee Venom] Swelling  .  Percocet [Oxycodone-Acetaminophen] Nausea Only    Home Medications:  (Not in a hospital admission)  OB/GYN Status:  No LMP for male patient.  General Assessment Data Location of Assessment: [facility] ED TTS Assessment: In system Is this a Tele or Face-to-Face Assessment?: Face-to-Face Is this an Initial Assessment or a Re-assessment for this encounter?: Initial Assessment Marital status: Married [place] name: NA Is patient pregnant?: No Pregnancy Status:  No Living Arrangements: Spouse/significant other, Children Can pt return to current living arrangement?: Yes Admission Status: Voluntary Is patient capable of signing voluntary admission?: Yes Referral Source: Self/Family/Friend Insurance type: [place]     Crisis Care Plan Living Arrangements: Spouse/significant other, Children Name of Psychiatrist: NA Name of Therapist: NA  Education Status Is patient currently in school?: No Current Grade: NA Highest grade of school patient has completed: 12 Name of school: [employer] Contact person: NA  Risk to self with the past 6 months Suicidal Ideation: Yes-Currently Present Has patient been a risk to self within the past 6 months prior to admission? : No Suicidal Intent: Yes-Currently Present Has patient had any suicidal intent within the past 6 months prior to admission? : Yes Is patient at risk for suicide?: Yes Suicidal Plan?: Yes-Currently Present Has patient had any suicidal plan within the past 6 months prior to admission? : Yes Specify Current Suicidal Plan: Pt cut wrists Access to Means: Yes Specify Access to Suicidal Means: access to razors What has been your use of drugs/alcohol within the last 12 months?: Alcohol Previous Attempts/Gestures: Yes How many times?: 1 Other Self Harm Risks: NA Triggers for Past Attempts: Other (Comment) Intentional Self Injurious Behavior: None Family Suicide History: No Recent stressful life event(s): Other (Comment) (separation from wife) Persecutory voices/beliefs?: No Depression: Yes Depression Symptoms: Loss of interest in usual pleasures, Feeling worthless/self pity, Feeling angry/irritable, Fatigue, Guilt Substance abuse history and/or treatment for substance abuse?: No Suicide prevention information given to non-admitted patients: Not applicable  Risk to Others within the past 6 months Homicidal Ideation: No Does patient have any lifetime risk of violence toward others beyond the six  months prior to admission? : No Thoughts of Harm to Others: No Current Homicidal Intent: No Current Homicidal Plan: No Access to Homicidal Means: No Identified Victim: NA History of harm to others?: No Assessment of Violence: None Noted Violent Behavior Description: NA Does patient have access to weapons?: No Criminal Charges Pending?: No Does patient have a court date: No Is patient on probation?: No  Psychosis Hallucinations: None noted Delusions: None noted  Mental Status Report Appearance/Hygiene: Unremarkable Eye Contact: Fair Motor Activity: Freedom of movement Speech: Logical/coherent Level of Consciousness: Alert Mood: Depressed Affect: Depressed Anxiety Level: None Thought Processes: Coherent, Relevant Judgement: Unimpaired Orientation: Person, Place, Time, Situation, Appropriate for developmental age Obsessive Compulsive Thoughts/Behaviors: None  Cognitive Functioning Concentration: Normal Memory: Recent Intact, Remote Intact IQ: Average Insight: Fair Impulse Control: Fair Appetite: Fair Weight Loss: 0 Weight Gain: 0 Sleep: No Change Total Hours of Sleep: 7 Vegetative Symptoms: None  ADLScreening [facility] Assessment Services) Patient's cognitive ability adequate to safely complete daily activities?: Yes Patient able to express need for assistance with ADLs?: Yes Independently performs ADLs?: Yes (appropriate for developmental age)  Prior Inpatient Therapy Prior Inpatient Therapy: No Prior Therapy Dates: Na Prior Therapy Facilty/Provider(s): NA Reason for Treatment: Na  Prior Outpatient Therapy Prior Outpatient Therapy: No Prior Therapy Dates: NA Prior Therapy Facilty/Provider(s): NA Reason for Treatment: NA Does patient have an ACCT team?: No Does patient have Intensive In-House Services?  :  No Does patient have [facility] services? : No Does patient have P4CC services?: No  ADL Screening (condition at time of admission) Patient's cognitive  ability adequate to safely complete daily activities?: Yes Is the patient deaf or have difficulty hearing?: No Does the patient have difficulty seeing, even when wearing glasses/contacts?: No Does the patient have difficulty concentrating, remembering, or making decisions?: No Patient able to express need for assistance with ADLs?: Yes Does the patient have difficulty dressing or bathing?: No Independently performs ADLs?: Yes (appropriate for developmental age) Does the patient have difficulty walking or climbing stairs?: No Weakness of Legs: None Weakness of Arms/Hands: None       Abuse/Neglect Assessment (Assessment to be complete while patient is alone) Physical Abuse: Denies Verbal Abuse: Denies Sexual Abuse: Denies Exploitation of patient/patient's resources: Denies Self-Neglect: Denies Values / Beliefs Cultural Requests During Hospitalization: None Spiritual Requests During Hospitalization: None   Advance Directives (For Healthcare) Does patient have an advance directive?: No Would patient like information on creating an advanced directive?: No - patient declined information    Additional Information 1:1 In Past 12 Months?: No CIRT Risk: No Elopement Risk: No Does patient have medical clearance?: Yes     Disposition:  Disposition Initial Assessment Completed for this Encounter: Yes Disposition of Patient: Inpatient treatment program Type of inpatient treatment program: Adult  Levette,Brandi D 12/10/2014 11:44 AM

## 2014-12-10 NOTE — ED Notes (Signed)
Per EMS pt comes from home c/o bilat wrist lacerations by razor blade that was self inflicted.  Pt and wife are going through separation. today pt sent text to his wife saying "that she may want to come home".  When she got there she found pt with both wrist cut.  Lacerations are covered with gauze and bleeding controlled at this time.  Pt has movement and feeling in bilat hands per EMS.  Vital in route were: BP151/93, 67HR, 100% room air

## 2014-12-10 NOTE — ED Provider Notes (Signed)
CSN: [id number]     Arrival date & time 12/10/14  0905 History   First MD Initiated Contact with Patient 12/10/14 1004     Chief Complaint  Patient presents with  . Suicidal  . Extremity Laceration     (Consider location/radiation/quality/duration/timing/severity/associated sxs/prior Treatment) HPI Comments: Pt comes in with cc of depression and cuts to his wrist. Pt allegedly called his wife, with whom he is having some  The history is provided by the patient.    Past Medical History  Diagnosis Date  . ADD (attention deficit disorder)   . Hyperlipidemia   . Hypertension   . Pneumonia   . Anxiety   . Kidney stone     per pt experience  . Complication of anesthesia   . PONV (postoperative nausea and vomiting)    Past Surgical History  Procedure Laterality Date  . Orthopedic surgery      right clavicle  . Orif clavicular fracture Left 09/10/2014    Procedure: OPEN REDUCTION INTERNAL FIXATION (ORIF) CLAVICULAR FRACTURE;  Surgeon: Jones Broom, MD;  Location: [facility] OR;  Service: Orthopedics;  Laterality: Left;  Left Open reduction internal fixatin clavical  . Orif clavicular fracture Left 10/22/2014    Procedure: OPEN REDUCTION INTERNAL FIXATION (ORIF) CLAVICULAR FRACTURE, HARDWARE REMOVAL ;  Surgeon: Jones Broom, MD;  Location: [facility];  Service: Orthopedics;  Laterality: Left;  Open reduction internal fixation left clavical fracture with left clavical hardware removal   Family History  Problem Relation Age of Onset  . Stroke Mother   . Heart attack Maternal Grandfather   . Heart attack Paternal Grandmother   . Stroke Paternal Grandmother   . Fibromyalgia Father    Social History  Substance Use Topics  . Smoking status: Former Smoker    Types: Cigarettes  . Smokeless tobacco: Never Used  . Alcohol Use: Yes     Comment: occ    Review of Systems  Constitutional: Negative for activity change and appetite change.  Respiratory: Negative for cough  and shortness of breath.   Cardiovascular: Negative for chest pain.  Gastrointestinal: Negative for abdominal pain.  Genitourinary: Negative for dysuria.  Psychiatric/Behavioral: Positive for suicidal ideas and self-injury.      Allergies  Yellow jacket venom and Percocet  Home Medications   Prior to Admission medications   Medication Sig Start Date End Date Taking? Authorizing Provider  amphetamine-dextroamphetamine (ADDERALL XR) 20 MG 24 hr capsule Take 1 capsule (20 mg total) by mouth daily. 09/14/14  Yes Sheliah Hatch, MD  fenofibrate 160 MG tablet TAKE 1 TABLET BY MOUTH ONCE DAILY Patient taking differently: Take 160 mg by mouth daily.  08/19/14  Yes Sheliah Hatch, MD  FLUoxetine (PROZAC) 40 MG capsule Take 1 capsule (40 mg total) by mouth daily. 10/14/14  Yes Sheliah Hatch, MD  valsartan (DIOVAN) 80 MG tablet Take 80 mg by mouth daily.   Yes Historical Provider, MD  HYDROcodone-acetaminophen (NORCO) 5-325 MG per tablet Take 1-2 tablets by mouth every 4 (four) hours as needed for moderate pain. Patient not taking: Reported on 12/10/2014 10/22/14   Jiles Harold, PA-C   BP 127/79 mmHg  Pulse 66  Temp(Src) 98.1 F (36.7 C) (Oral)  Resp 16  SpO2 100% Physical Exam  Constitutional: He is oriented to person, place, and time. He appears well-developed.  HENT:  Head: Normocephalic and atraumatic.  Eyes: Conjunctivae and EOM are normal. Pupils are equal, round, and reactive to light.  Neck: Normal range of motion.  Neck supple.  Cardiovascular: Normal rate, regular rhythm and intact distal pulses.   Pulmonary/Chest: Effort normal and breath sounds normal.  Abdominal: Soft. Bowel sounds are normal. He exhibits no distension. There is no tenderness. There is no rebound and no guarding.  Neurological: He is alert and oriented to person, place, and time.  No numbness.  Skin: Skin is warm.  3 cm horizontal lac right 5 cm vertical lac left side Both lac are very  superficial and proximal to the wrist.   Psychiatric:  Flat affect  Nursing note and vitals reviewed.   ED Course  Wound closure utilizing adhes only Date/Time: 12/10/2014 11:23 AM Performed by: Derwood Kaplan Authorized by: Derwood Kaplan Consent: Verbal consent obtained. Risks and benefits: risks, benefits and alternatives were discussed Consent given by: patient Patient understanding: patient states understanding of the procedure being performed Patient identity confirmed: arm band Time out: Immediately prior to procedure a "time out" was called to verify the correct patient, procedure, equipment, support staff and site/side marked as required. Local anesthesia used: no Patient sedated: no Patient tolerance: Patient tolerated the procedure well with no immediate complications Comments: Total of 8 cm of superficial laceration repaired over the right and left wrist. Wound irrigated, cleaned and alcohol swab used to clear up the area.   (including critical care time) Labs Review Labs Reviewed  COMPREHENSIVE METABOLIC PANEL - Abnormal; Notable for the following:    Glucose, Bld 102 (*)    ALT 15 (*)    All other components within normal limits  ACETAMINOPHEN LEVEL - Abnormal; Notable for the following:    Acetaminophen (Tylenol), Serum <10 (*)    All other components within normal limits  CBC - Abnormal; Notable for the following:    RBC 4.14 (*)    Hemoglobin 12.7 (*)    HCT 38.9 (*)    All other components within normal limits  URINE RAPID DRUG SCREEN, HOSP PERFORMED - Abnormal; Notable for the following:    Amphetamines POSITIVE (*)    All other components within normal limits  ETHANOL  SALICYLATE LEVEL    Imaging Review No results found. I have personally reviewed and evaluated these images and lab results as part of my medical decision-making.   EKG Interpretation None      MDM   Final diagnoses:  Skin laceration  Suicidal thoughts    Pt comes in  with SI and cuts to the wrist. Cuts are superficial, and repaired with dermabond and steri strips. Psych consulted, and he has been accepted for hs SI, just awaiting bed at this time. TDAP updated. CIWA ordered.  Derwood Kaplan, MD 12/10/14 1526

## 2014-12-10 NOTE — ED Notes (Signed)
md at bedside

## 2014-12-11 DIAGNOSIS — T1491 Suicide attempt: Secondary | ICD-10-CM | POA: Diagnosis not present

## 2014-12-11 DIAGNOSIS — X789XXA Intentional self-harm by unspecified sharp object, initial encounter: Secondary | ICD-10-CM | POA: Diagnosis not present

## 2014-12-11 DIAGNOSIS — E781 Pure hyperglyceridemia: Secondary | ICD-10-CM | POA: Diagnosis present

## 2014-12-11 DIAGNOSIS — G4700 Insomnia, unspecified: Secondary | ICD-10-CM | POA: Diagnosis present

## 2014-12-11 DIAGNOSIS — F332 Major depressive disorder, recurrent severe without psychotic features: Secondary | ICD-10-CM | POA: Diagnosis present

## 2014-12-11 DIAGNOSIS — Z8249 Family history of ischemic heart disease and other diseases of the circulatory system: Secondary | ICD-10-CM | POA: Diagnosis not present

## 2014-12-11 DIAGNOSIS — S61512A Laceration without foreign body of left wrist, initial encounter: Secondary | ICD-10-CM | POA: Diagnosis not present

## 2014-12-11 DIAGNOSIS — T1491XA Suicide attempt, initial encounter: Secondary | ICD-10-CM | POA: Diagnosis present

## 2014-12-11 DIAGNOSIS — Z823 Family history of stroke: Secondary | ICD-10-CM | POA: Diagnosis not present

## 2014-12-11 DIAGNOSIS — F410 Panic disorder [episodic paroxysmal anxiety] without agoraphobia: Secondary | ICD-10-CM | POA: Diagnosis present

## 2014-12-11 DIAGNOSIS — S61511A Laceration without foreign body of right wrist, initial encounter: Secondary | ICD-10-CM | POA: Diagnosis not present

## 2014-12-11 DIAGNOSIS — I10 Essential (primary) hypertension: Secondary | ICD-10-CM | POA: Diagnosis present

## 2014-12-11 DIAGNOSIS — F909 Attention-deficit hyperactivity disorder, unspecified type: Secondary | ICD-10-CM | POA: Diagnosis present

## 2014-12-11 MED ORDER — INFLUENZA VAC SPLIT QUAD 0.5 ML IM SUSY
0.5000 mL | PREFILLED_SYRINGE | INTRAMUSCULAR | Status: AC
Start: 2014-12-12 — End: 2014-12-13
  Administered 2014-12-13: 0.5 mL via INTRAMUSCULAR
  Filled 2014-12-11: qty 0.5

## 2014-12-11 MED ORDER — IRBESARTAN 75 MG PO TABS
37.5000 mg | ORAL_TABLET | Freq: Every day | ORAL | Status: DC
  Administered 2014-12-11 – 2014-12-16 (×6): 37.5 mg via ORAL
  Filled 2014-12-11 (×8): qty 0.5

## 2014-12-11 MED ORDER — FLUOXETINE HCL 20 MG PO CAPS
40.0000 mg | ORAL_CAPSULE | Freq: Every day | ORAL | Status: DC
  Administered 2014-12-11 – 2014-12-14 (×4): 40 mg via ORAL
  Filled 2014-12-11 (×7): qty 2

## 2014-12-11 MED ORDER — FENOFIBRATE 160 MG PO TABS
160.0000 mg | ORAL_TABLET | Freq: Every day | ORAL | Status: DC
  Administered 2014-12-11 – 2014-12-16 (×6): 160 mg via ORAL
  Filled 2014-12-11 (×8): qty 1

## 2014-12-11 MED ORDER — HYDROXYZINE HCL 25 MG PO TABS: 25.0000 mg | ORAL_TABLET | Freq: Three times a day (TID) | ORAL | Status: DC | PRN

## 2014-12-11 MED ORDER — ALUM & MAG HYDROXIDE-SIMETH 200-200-20 MG/5ML PO SUSP: 30.0000 mL | ORAL | Status: DC | PRN

## 2014-12-11 MED ORDER — TRAZODONE HCL 50 MG PO TABS
50.0000 mg | ORAL_TABLET | Freq: Every evening | ORAL | Status: DC | PRN
  Administered 2014-12-11 – 2014-12-13 (×3): 50 mg via ORAL
  Filled 2014-12-11 (×3): qty 1

## 2014-12-11 MED ORDER — MAGNESIUM HYDROXIDE 400 MG/5ML PO SUSP: 30.0000 mL | Freq: Every day | ORAL | Status: DC | PRN

## 2014-12-11 MED ORDER — AMPHETAMINE-DEXTROAMPHET ER 10 MG PO CP24
20.0000 mg | ORAL_CAPSULE | Freq: Every day | ORAL | Status: DC
Start: 2014-12-11 — End: 2014-12-16
  Administered 2014-12-11 – 2014-12-16 (×6): 20 mg via ORAL
  Filled 2014-12-11 (×3): qty 2
  Filled 2014-12-11: qty 1
  Filled 2014-12-11 (×2): qty 2

## 2014-12-11 NOTE — Progress Notes (Signed)
Pt admitted from observation unit with increased depression. Pt is guarded and vague on admission when assessing stressors. Pt says that he has relationship problems and job stressors as his highest stressors. Pt had removed steri strips before coming on the unit. He denies hi and hallucinations. Pt is not interested in therapy and is more interested in medication adjustment. Pt has a hx of HTN, ADD and high cholesterol.

## 2014-12-11 NOTE — Progress Notes (Addendum)
Gave report to Jan RN and patient transferred to Adult Unit with Admission NT. Agree with charting from orientee Renato Gails [phone]

## 2014-12-11 NOTE — [facility] Counselor (Signed)
Per Christen Bame, NP inpatient treatment is recommended.   Resources Given: Mobile Crisis line contact and info placed in patient file in [facility] OBS.Suicide prevention pamphlet with call line placed in patient file at [facility] O[facility], and List of outpatient psychiatry and counseling services placed in client file in [facility] OBS unit.  Shean K. Harris, MS, NCC, LCAS-A  Counselor 12/11/2014 3:08 AM

## 2014-12-11 NOTE — Progress Notes (Signed)
Adult Psychoeducational Group Note  Date:  12/11/2014 Time:  9:35 PM  Group Topic/Focus:  Wrap-Up Group:   The focus of this group is to help patients review their daily goal of treatment and discuss progress on daily workbooks.  Participation Level:  Active  Participation Quality:  Appropriate and Attentive  Affect:  Appropriate  Cognitive:  Appropriate  Insight: Appropriate and Good  Engagement in Group:  Engaged  Modes of Intervention:  Education  Additional Comments:  Pt relapse prevention goal is to take medication and to stay away from crazy women. Pt goal when leaving here is to take medication and keep mind of bad stuff.   Merlinda Frederick 12/11/2014, 9:35 PM

## 2014-12-11 NOTE — Progress Notes (Signed)
Patient sleeping soundly when Nurse awakening him to administer meds and have a quiet one to one assessment. Patient denies any SI/HI/AVH, mobility WNL's, bandages clean, dry and intact to bilateral wrists.  Patient has very flat affect that is congruent with depressed mood. Patient contracts for safety, is compliant with medications.

## 2014-12-11 NOTE — BH Assessment (Signed)
[facility] Assessment Progress Note Patient was seen this date by this writer and Fransisca Kaufmann NP to evaluate patient. Patient was admitted on 9/22 with lacerations on both wrists from a apparent suicide attempt . Patient stated he has been depressed for "several months" and has often had thoughts of taking his life. Patient is currently taking MH medications which are being prescribed by his primary physician although patient states he continues to be depressed rating his depression at a 6 this date. Patient states he has means to access a weapon if discharged and continues to be suicidal with a plan. Patient meets inpatient criteria and will be admitted once bed availability is located.

## 2014-12-11 NOTE — H&P (Signed)
Observation Admission Assessment Adult Patient Identification: Keith Johnson MRN:  [id number] Date of Evaluation:  12/11/2014 Chief Complaint:  Major Depressive Disorder Principal Diagnosis: <principal problem not specified> Diagnosis:   Patient Active Problem List   Diagnosis Date Noted  . 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 Physical exam [Z00.00] 08/19/2014  . Anxiety state [F41.1] 08/19/2014  . Allergic rhinitis [J30.9] 09/10/2013  . Sinusitis, acute frontal [J01.10] 08/01/2013  . Cerumen impaction [H61.20] 08/01/2013  . HTN (hypertension) [I10] 06/02/2013  . Hypertriglyceridemia [E78.1] 06/02/2013  . Rib pain on right side [R07.81] 10/09/2012  . ADD (attention deficit disorder) [F90.9] 08/05/2012  . SHOULDER PAIN, RIGHT [M25.519] 10/15/2008  . KNEE PAIN, RIGHT [M25.569] 10/15/2008  . THUMB PAIN, LEFT [M79.609] 10/15/2008   History of Present Illness:: Chart reviewed and Patient interviewed.  Keith Johnson is a 39 y.o white male presents to the hospital with Suicide attempt by cuts to both wrists.  Patient reports he contacted his exstraged wife after he cut his wrists.  He states 4 months ago started having problem with anxiety and agitation was prescribed Prozac by his family doctor.  He states the medication  was working well but recently he starting getting depressed, feeling overwhelmed and stressed by work and  his marriage/relationship.  He reports his first marriage ended in divorce. He has  3 boys with first wife, the 57 year old and  35 year old who live with him and the 37 year old lives with his mom.  He reports his 2nd marriage is failing and that is depressing.  He reports he is currently separated from his second wife and reports that he desired the separation.  He reports upsetting his exstraged wife got him depressed because "it is hard to rip myself away from people".  He reports currently on Adderall 20 mg daily for ADHD. He  report diagnosed with ADHD at 17 years. He reports he was  prescribed Ritalin which he used till age 86 year. Patient is also hypertensive and on medication.  He denies ever receiving outpatient counseling or inpatient treatment.  According to Patient "I do not believe in therapy".   Patient presents as  alert, calm and oriented x3.  Patient is guarded, speech clear and coherent.  He presents as depressed, thought process logical and goal directed. He denies HI/AVH. He reports good family support. He reports his parents are disable but live in the local area.  He report has a brothe who lives and works in [place].  Patient will benefit from psychiatric inpatient treatment.      Per TTS Consult note:   Keith Johnson is an 39 y.o. male. Pt reports SI. Pt denies HI. Pt denies ABH. Pt appeared to have cut both of his wrists. Pt's wrists are currently bandaged. Pt contacted his wife once he cut his wrists. Pt states that he was recently separated from his wife and that has caused severe depression. Pt reports tearfulness, fatigue, isolating, depressed mood most of the day, and self-pity. Pt reports 1 previous SI attempt. Pt denies previous or current outpatient or inpatient treatment. According to the Pt, he does not believe in therapy. Pt states that he is prescribed Adderall and Prozac. Pt states that Dr. Geralyn Flash prescribes his medication. Pt reports occasional alcohol use. Pt denies previous or current abuse.   Elements:  Location:  MDD. Quality:  See HPI. Severity:  See HPI. Context:  See HPI. Associated Signs/Symptoms: Depression Symptoms:  depressed  mood, difficulty concentrating, hopelessness, suicidal attempt, anxiety, (Hypo) Manic Symptoms:  NA Anxiety Symptoms:  Anxiety Psychotic Symptoms:  NA PTSD Symptoms: NA NA Total Time spent with patient: 30 minutes  Past Medical History:  Past Medical History  Diagnosis Date  . ADD (attention deficit disorder)   . Hyperlipidemia   .  Hypertension   . Pneumonia   . Anxiety   . Complication of anesthesia   . PONV (postoperative nausea and vomiting)   . Kidney stones     Past Surgical History  Procedure Laterality Date  . Orthopedic surgery      right clavicle  . Orif clavicular fracture Left 09/10/2014    Procedure: OPEN REDUCTION INTERNAL FIXATION (ORIF) CLAVICULAR FRACTURE;  Surgeon: Tania Ade, MD;  Location: [facility];  Service: Orthopedics;  Laterality: Left;  Left Open reduction internal fixatin clavical  . Orif clavicular fracture Left 10/22/2014    Procedure: OPEN REDUCTION INTERNAL FIXATION (ORIF) CLAVICULAR FRACTURE, HARDWARE REMOVAL ;  Surgeon: Tania Ade, MD;  Location: [facility];  Service: Orthopedics;  Laterality: Left;  Open reduction internal fixation left clavical fracture with left clavical hardware removal   Family History:  Family History  Problem Relation Age of Onset  . Stroke Mother   . Heart attack Maternal Grandfather   . Heart attack Paternal Grandmother   . Stroke Paternal Grandmother   . Fibromyalgia Father    Social History:  History  Alcohol Use  . Yes    Comment: daily sometimes up to 4 shots of rum a day     History  Drug Use No    Social History   Social History  . Marital Status: Married    Spouse Name: N/A  . Number of Children: N/A  . Years of Education: N/A   Social History Main Topics  . Smoking status: Never Smoker   . Smokeless tobacco: Never Used  . Alcohol Use: Yes     Comment: daily sometimes up to 4 shots of rum a day  . Drug Use: No  . Sexual Activity: Yes   Other Topics Concern  . None   Social History Narrative   Additional Social History:                          Musculoskeletal: Strength & Muscle Tone: within normal limits Gait & Station: normal Patient leans: N/A  Psychiatric Specialty Exam: Physical Exam  ROS  Blood pressure 143/92, pulse 79, temperature 98.4 F (36.9 C), temperature source Oral.There  is no weight on file to calculate BMI.  General Appearance: Casual and Neat  Eye Contact::  Good  Speech:  Clear and Coherent and Normal Rate  Volume:  Normal  Mood:  Anxious and Depressed  Affect:  Appropriate and Congruent  Thought Process:  Goal Directed, Intact and Logical  Orientation:  Full (Time, Place, and Person)  Thought Content:  WDL  Suicidal Thoughts:  Yes.  with intent/plan  Homicidal Thoughts:  No  Memory:  Immediate;   Good Recent;   Good Remote;   Good  Judgement:  Fair  Insight:  Fair  Psychomotor Activity:  Normal  Concentration:  Fair  Recall:  Good  Fund of Knowledge:Good  Language: Good  Akathisia:  Negative  Handed:  Right  AIMS (if indicated):     Assets:  Communication Skills Desire for Improvement Financial Resources/Insurance Housing Physical Health Resilience Social Support Transportation  ADL's:  Intact  Cognition: WNL  Sleep:  Risk to Self: Is patient at risk for suicide?: Yes Risk to Others:   Prior Inpatient Therapy:   Prior Outpatient Therapy:    Alcohol Screening: 1. How often do you have a drink containing alcohol?: 4 or more times a week 2. How many drinks containing alcohol do you have on a typical day when you are drinking?: 3 or 4 3. How often do you have six or more drinks on one occasion?: Less than monthly Preliminary Score: 2 4. How often during the last year have you found that you were not able to stop drinking once you had started?: Never 5. How often during the last year have you failed to do what was normally expected from you becasue of drinking?: Never 6. How often during the last year have you needed a first drink in the morning to get yourself going after a heavy drinking session?: Never 7. How often during the last year have you had a feeling of guilt of remorse after drinking?: Never 8. How often during the last year have you been unable to remember what happened the night before because you had been  drinking?: Never 9. Have you or someone else been injured as a result of your drinking?: No 10. Has a relative or friend or a doctor or another health worker been concerned about your drinking or suggested you cut down?: No Alcohol Use Disorder Identification Test Final Score (AUDIT): 6 Brief Intervention: AUDIT score less than 7 or less-screening does not suggest unhealthy drinking-brief intervention not indicated  Allergies:   Allergies  Allergen Reactions  . Yellow Jacket Venom [Bee Venom] Swelling  . Percocet [Oxycodone-Acetaminophen] Nausea Only   Lab Results:  Results for orders placed or performed during the hospital encounter of 12/10/14 (from the past 48 hour(s))  Comprehensive metabolic panel     Status: Abnormal   Collection Time: 12/10/14  9:26 AM  Result Value Ref Range   Sodium 138 135 - 145 mmol/L   Potassium 3.8 3.5 - 5.1 mmol/L   Chloride 106 101 - 111 mmol/L   CO2 24 22 - 32 mmol/L   Glucose, Bld 102 (H) 65 - 99 mg/dL   BUN 10 6 - 20 mg/dL   Creatinine, Ser 0.95 0.61 - 1.24 mg/dL   Calcium 9.2 8.9 - 10.3 mg/dL   Total Protein 7.5 6.5 - 8.1 g/dL   Albumin 4.4 3.5 - 5.0 g/dL   AST 21 15 - 41 U/L   ALT 15 (L) 17 - 63 U/L   Alkaline Phosphatase 48 38 - 126 U/L   Total Bilirubin 0.5 0.3 - 1.2 mg/dL   GFR calc non Af Amer >60 >60 mL/min   GFR calc Af Amer >60 >60 mL/min    Comment: (NOTE) The eGFR has been calculated using the CKD EPI equation. This calculation has not been validated in all clinical situations. eGFR's persistently <60 mL/min signify possible Chronic Kidney Disease.    Anion gap 8 5 - 15  Ethanol (ETOH)     Status: None   Collection Time: 12/10/14  9:26 AM  Result Value Ref Range   Alcohol, Ethyl (B) <5 <5 mg/dL    Comment:        LOWEST DETECTABLE LIMIT FOR SERUM ALCOHOL IS 5 mg/dL FOR MEDICAL PURPOSES ONLY   Salicylate level     Status: None   Collection Time: 12/10/14  9:26 AM  Result Value Ref Range   Salicylate Lvl <2.6 2.8 - 30.0  mg/dL  Acetaminophen  level     Status: Abnormal   Collection Time: 12/10/14  9:26 AM  Result Value Ref Range   Acetaminophen (Tylenol), Serum <10 (L) 10 - 30 ug/mL    Comment:        THERAPEUTIC CONCENTRATIONS VARY SIGNIFICANTLY. A RANGE OF 10-30 ug/mL MAY BE AN EFFECTIVE CONCENTRATION FOR MANY PATIENTS. HOWEVER, SOME ARE BEST TREATED AT CONCENTRATIONS OUTSIDE THIS RANGE. ACETAMINOPHEN CONCENTRATIONS >150 ug/mL AT 4 HOURS AFTER INGESTION AND >50 ug/mL AT 12 HOURS AFTER INGESTION ARE OFTEN ASSOCIATED WITH TOXIC REACTIONS.   CBC     Status: Abnormal   Collection Time: 12/10/14  9:26 AM  Result Value Ref Range   WBC 5.1 4.0 - 10.5 K/uL   RBC 4.14 (L) 4.22 - 5.81 MIL/uL   Hemoglobin 12.7 (L) 13.0 - 17.0 g/dL   HCT 38.9 (L) 39.0 - 52.0 %   MCV 94.0 78.0 - 100.0 fL   MCH 30.7 26.0 - 34.0 pg   MCHC 32.6 30.0 - 36.0 g/dL   RDW 13.6 11.5 - 15.5 %   Platelets 279 150 - 400 K/uL  Urine rapid drug screen (hosp performed) (Not at [facility])     Status: Abnormal   Collection Time: 12/10/14  9:29 AM  Result Value Ref Range   Opiates NONE DETECTED NONE DETECTED   Cocaine NONE DETECTED NONE DETECTED   Benzodiazepines NONE DETECTED NONE DETECTED   Amphetamines POSITIVE (A) NONE DETECTED   Tetrahydrocannabinol NONE DETECTED NONE DETECTED   Barbiturates NONE DETECTED NONE DETECTED    Comment:        DRUG SCREEN FOR MEDICAL PURPOSES ONLY.  IF CONFIRMATION IS NEEDED FOR ANY PURPOSE, NOTIFY LAB WITHIN 5 DAYS.        LOWEST DETECTABLE LIMITS FOR URINE DRUG SCREEN Drug Class       Cutoff (ng/mL) Amphetamine      1000 Barbiturate      200 Benzodiazepine   101 Tricyclics       751 Opiates          300 Cocaine          300 THC              50    Current Medications: Current Facility-Administered Medications  Medication Dose Route Frequency Provider Last Rate Last Dose  . alum & mag hydroxide-simeth (MAALOX/MYLANTA) 200-200-20 MG/5ML suspension 30 mL  30 mL Oral Q4H PRN Harriet Butte,  NP      . amphetamine-dextroamphetamine (ADDERALL XR) 24 hr capsule 20 mg  20 mg Oral Daily Harriet Butte, NP      . fenofibrate tablet 160 mg  160 mg Oral Daily Harriet Butte, NP      . FLUoxetine (PROZAC) capsule 40 mg  40 mg Oral Daily Harriet Butte, NP      . irbesartan (AVAPRO) tablet 37.5 mg  37.5 mg Oral Daily Harriet Butte, NP      . magnesium hydroxide (MILK OF MAGNESIA) suspension 30 mL  30 mL Oral Daily PRN Harriet Butte, NP       PTA Medications: Prescriptions prior to admission  Medication Sig Dispense Refill Last Dose  . amphetamine-dextroamphetamine (ADDERALL XR) 20 MG 24 hr capsule Take 1 capsule (20 mg total) by mouth daily. 30 capsule 0 12/10/2014 at Unknown time  . fenofibrate 160 MG tablet TAKE 1 TABLET BY MOUTH ONCE DAILY (Patient taking differently: Take 160 mg by mouth daily. ) 90 tablet 1 12/10/2014 at Unknown time  .  FLUoxetine (PROZAC) 40 MG capsule Take 1 capsule (40 mg total) by mouth daily. 30 capsule 6 12/10/2014 at Unknown time  . valsartan (DIOVAN) 80 MG tablet Take 80 mg by mouth daily.   12/10/2014 at Unknown time  . HYDROcodone-acetaminophen (NORCO) 5-325 MG per tablet Take 1-2 tablets by mouth every 4 (four) hours as needed for moderate pain. (Patient not taking: Reported on 12/10/2014) 50 tablet 0 Not Taking    Previous Psychotropic Medications: Yes   Substance Abuse History in the last 12 months:  Yes.      Consequences of Substance Abuse: NA  Results for orders placed or performed during the hospital encounter of 12/10/14 (from the past 72 hour(s))  Comprehensive metabolic panel     Status: Abnormal   Collection Time: 12/10/14  9:26 AM  Result Value Ref Range   Sodium 138 135 - 145 mmol/L   Potassium 3.8 3.5 - 5.1 mmol/L   Chloride 106 101 - 111 mmol/L   CO2 24 22 - 32 mmol/L   Glucose, Bld 102 (H) 65 - 99 mg/dL   BUN 10 6 - 20 mg/dL   Creatinine, Ser 0.95 0.61 - 1.24 mg/dL   Calcium 9.2 8.9 - 10.3 mg/dL   Total Protein 7.5 6.5 - 8.1  g/dL   Albumin 4.4 3.5 - 5.0 g/dL   AST 21 15 - 41 U/L   ALT 15 (L) 17 - 63 U/L   Alkaline Phosphatase 48 38 - 126 U/L   Total Bilirubin 0.5 0.3 - 1.2 mg/dL   GFR calc non Af Amer >60 >60 mL/min   GFR calc Af Amer >60 >60 mL/min    Comment: (NOTE) The eGFR has been calculated using the CKD EPI equation. This calculation has not been validated in all clinical situations. eGFR's persistently <60 mL/min signify possible Chronic Kidney Disease.    Anion gap 8 5 - 15  Ethanol (ETOH)     Status: None   Collection Time: 12/10/14  9:26 AM  Result Value Ref Range   Alcohol, Ethyl (B) <5 <5 mg/dL    Comment:        LOWEST DETECTABLE LIMIT FOR SERUM ALCOHOL IS 5 mg/dL FOR MEDICAL PURPOSES ONLY   Salicylate level     Status: None   Collection Time: 12/10/14  9:26 AM  Result Value Ref Range   Salicylate Lvl <1.9 2.8 - 30.0 mg/dL  Acetaminophen level     Status: Abnormal   Collection Time: 12/10/14  9:26 AM  Result Value Ref Range   Acetaminophen (Tylenol), Serum <10 (L) 10 - 30 ug/mL    Comment:        THERAPEUTIC CONCENTRATIONS VARY SIGNIFICANTLY. A RANGE OF 10-30 ug/mL MAY BE AN EFFECTIVE CONCENTRATION FOR MANY PATIENTS. HOWEVER, SOME ARE BEST TREATED AT CONCENTRATIONS OUTSIDE THIS RANGE. ACETAMINOPHEN CONCENTRATIONS >150 ug/mL AT 4 HOURS AFTER INGESTION AND >50 ug/mL AT 12 HOURS AFTER INGESTION ARE OFTEN ASSOCIATED WITH TOXIC REACTIONS.   CBC     Status: Abnormal   Collection Time: 12/10/14  9:26 AM  Result Value Ref Range   WBC 5.1 4.0 - 10.5 K/uL   RBC 4.14 (L) 4.22 - 5.81 MIL/uL   Hemoglobin 12.7 (L) 13.0 - 17.0 g/dL   HCT 38.9 (L) 39.0 - 52.0 %   MCV 94.0 78.0 - 100.0 fL   MCH 30.7 26.0 - 34.0 pg   MCHC 32.6 30.0 - 36.0 g/dL   RDW 13.6 11.5 - 15.5 %   Platelets 279 150 -  400 K/uL  Urine rapid drug screen (hosp performed) (Not at [facility])     Status: Abnormal   Collection Time: 12/10/14  9:29 AM  Result Value Ref Range   Opiates NONE DETECTED NONE DETECTED    Cocaine NONE DETECTED NONE DETECTED   Benzodiazepines NONE DETECTED NONE DETECTED   Amphetamines POSITIVE (A) NONE DETECTED   Tetrahydrocannabinol NONE DETECTED NONE DETECTED   Barbiturates NONE DETECTED NONE DETECTED    Comment:        DRUG SCREEN FOR MEDICAL PURPOSES ONLY.  IF CONFIRMATION IS NEEDED FOR ANY PURPOSE, NOTIFY LAB WITHIN 5 DAYS.        LOWEST DETECTABLE LIMITS FOR URINE DRUG SCREEN Drug Class       Cutoff (ng/mL) Amphetamine      1000 Barbiturate      200 Benzodiazepine   812 Tricyclics       751 Opiates          300 Cocaine          300 THC              50     Observation Level/Precautions:  Continuous Observation  Laboratory:  Per ED  Psychotherapy:  OBS  Medications:  See Orders  Consultations:  OBS  Discharge Concerns:  Mood stabilization & safety  Estimated LOS: OBS stay  Other:     Psychological Evaluations: Yes  Treatment Plan Summary: Daily contact with patient to assess and evaluate symptoms and progress in treatment and Medication management  Medical Decision Making:  Review of Psycho-Social Stressors (1), Decision to obtain old records (1), Review and summation of old records (2) and Review of Medication Regimen & Side Effects (2)  I certify that inpatient services furnished can reasonably be expected to improve the patient's condition.   Samantha Crimes, PMHNP-BC 9/23/20165:05 AM

## 2014-12-11 NOTE — Tx Team (Addendum)
Initial Interdisciplinary Treatment Plan   PATIENT STRESSORS: Marital or family conflict Occupational concerns   PATIENT STRENGTHS: Ability for insight Average or above average intelligence Capable of independent living Communication skills General fund of knowledge Physical Health   PROBLEM LIST: Problem List/Patient Goals Date to be addressed Date deferred Reason deferred Estimated date of resolution  Family conflict 12/11/14     Job stress 12/11/14           "I want to find ways to get my mind off of 'bad' stuff"                                     DISCHARGE CRITERIA:  Ability to meet basic life and health needs Improved stabilization in mood, thinking, and/or behavior Medical problems require only outpatient monitoring Motivation to continue treatment in a less acute level of care Need for constant or close observation no longer present Reduction of life-threatening or endangering symptoms to within safe limits Safe-care adequate arrangements made Verbal commitment to aftercare and medication compliance  PRELIMINARY DISCHARGE PLAN: Outpatient therapy Placement in alternative living arrangements  PATIENT/FAMIILY INVOLVEMENT: This treatment plan has been presented to and reviewed with the patient, Keith Johnson, and/or family member, .  The patient and family have been given the opportunity to ask questions and make suggestions.  Beatrix Shipper 12/11/2014, 4:05 PM

## 2014-12-12 NOTE — Progress Notes (Signed)
D: Pt is alert and oriented x 4. Pt complained of mild depression of 1 on a 0-10 depression scale and denies anxiety; Pt was however quick to says that he had been very anxious earlier. He state, "I was very anxious this morning after talking to my wife, but as the day went by and was able to mix in, I started to feel better." Pt denies pain, SI/HI and AVH. Pt is very optimistic when it comes to the outcome of his treatment.  A: Medications offered as prescribed.  Pt attended wrap-up group. Support, encouragement, and safe environment provided.  15-minute safety checks continue.  R: Pt was med compliant.  Pt attended group. Safety checks continue.

## 2014-12-12 NOTE — Progress Notes (Signed)
`  Keith Johnson is adjusting well to the hospital. He completed his daily assessment and on it he wrote he denied SI and he rated his depression, hopelessness and anxiety " 2/0/2", respectively.   A He has attended his groups and he is open to trying to learn healthier coping skills.   R Safety in place.

## 2014-12-12 NOTE — Progress Notes (Signed)
Adult Psychoeducational Group Note  Date:  12/12/2014 Time:  9:55 PM  Group Topic/Focus:  Wrap-Up Group:   The focus of this group is to help patients review their daily goal of treatment and discuss progress on daily workbooks.  Participation Level:  Active  Participation Quality:  Appropriate  Affect:  Appropriate  Cognitive:  Appropriate  Insight: Appropriate  Engagement in Group:  Engaged  Modes of Intervention:  Discussion  Additional Comments: The patient expressed that he attended group.  Octavio Manns 12/12/2014, 9:55 PM

## 2014-12-12 NOTE — [facility] Group Notes (Signed)
[facility] Group Notes:  (Nursing/MHT/Case Management/Adjunct)  Date:  12/12/2014  Time:  9:33 AM  Type of Therapy:  Psychoeducational Skills  Participation Level:  Active  Participation Quality:  Appropriate  Affect:  Appropriate  Cognitive:  Appropriate  Insight:  Appropriate  Engagement in Group:  Engaged  Modes of Intervention:  Discussion  Summary of Progress/Problems: Pt did attend self inventory group.    Jacquelyne Balint Shanta 12/12/2014, 9:33 AM

## 2014-12-12 NOTE — H&P (Signed)
Psychiatric Admission Assessment Adult  Patient Identification: Keith Johnson MRN:  [id number] Date of Evaluation:  12/12/2014 Chief Complaint:  Major Depressive Disorder Principal Diagnosis: SA cuts to his wrist Diagnosis:   Patient Active Problem List   Diagnosis Date Noted  . 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 Suicide attempt [T14.91] 12/11/2014  . Physical exam [Z00.00] 08/19/2014  . Anxiety state [F41.1] 08/19/2014  . Allergic rhinitis [J30.9] 09/10/2013  . Sinusitis, acute frontal [J01.10] 08/01/2013  . Cerumen impaction [H61.20] 08/01/2013  . HTN (hypertension) [I10] 06/02/2013  . Hypertriglyceridemia [E78.1] 06/02/2013  . Rib pain on right side [R07.81] 10/09/2012  . ADD (attention deficit disorder) [F90.9] 08/05/2012  . SHOULDER PAIN, RIGHT [M25.519] 10/15/2008  . KNEE PAIN, RIGHT [M25.569] 10/15/2008  . THUMB PAIN, LEFT [M79.609] 10/15/2008   History of Present Illness::  Keith Johnson is a 39 y.o white male presents to the hospital with Suicide attempt by cuts to both wrists. Patient reports he contacted his exstraged wife after he cut his wrists. He states 4 months ago started having problem with anxiety and agitation was prescribed Prozac by his family doctor. He states the medication was working well but recently he starting getting depressed, feeling overwhelmed and stressed by work and his marriage/relationship. He reports his first marriage ended in divorce. He has 3 boys with first wife, the 84 year old and 69 year old who live with him and the 37 year old lives with his mom. He reports his 2nd marriage is failing and that is depressing. He reports he is currently separated from his second wife and reports that he desired the separation. He reports upsetting his exstraged wife got him depressed because "it is hard to rip myself away from people". He reports currently on Adderall 20 mg daily for ADHD. He report  diagnosed with ADHD at 17 years. He reports he was prescribed Ritalin which he used till age 37 year. Patient is also hypertensive and on medication. He denies ever receiving outpatient counseling or inpatient treatment. According to Patient "I do not believe in therapy".   Elements: 39 yo male, MDD and SA Associated Signs/Symptoms: Depression Symptoms:  depressed mood, anhedonia, psychomotor agitation, suicidal attempt, anxiety, panic attacks, (Hypo) Manic Symptoms:  Irritable Mood, Anxiety Symptoms:  Excessive Worry, Psychotic Symptoms:   PTSD Symptoms: Re-experiencing:  2nd divorce similar to 1st divorce, and is upsetting him Total Time spent with patient: 30 minutes  Past Medical History:  Past Medical History  Diagnosis Date  . ADD (attention deficit disorder)   . Hyperlipidemia   . Hypertension   . Pneumonia   . Anxiety   . Complication of anesthesia   . PONV (postoperative nausea and vomiting)   . Kidney stones     Past Surgical History  Procedure Laterality Date  . Orthopedic surgery      right clavicle  . Orif clavicular fracture Left 09/10/2014    Procedure: OPEN REDUCTION INTERNAL FIXATION (ORIF) CLAVICULAR FRACTURE;  Surgeon: Tania Ade, MD;  Location: [place];  Service: Orthopedics;  Laterality: Left;  Left Open reduction internal fixatin clavical  . Orif clavicular fracture Left 10/22/2014    Procedure: OPEN REDUCTION INTERNAL FIXATION (ORIF) CLAVICULAR FRACTURE, HARDWARE REMOVAL ;  Surgeon: Tania Ade, MD;  Location: [place];  Service: Orthopedics;  Laterality: Left;  Open reduction internal fixation left clavical fracture with left clavical hardware removal   Family History:  Family History  Problem Relation Age of Onset  .  Stroke Mother   . Heart attack Maternal Grandfather   . Heart attack Paternal Grandmother   . Stroke Paternal Grandmother   . Fibromyalgia Father    Social History: He reports good family support. He reports  his parents are disable but live in the local area. He report has a brothe who lives and works in [place]. Patient will benefit from psychiatric inpatient treatment.  History  Alcohol Use  . Yes    Comment: daily sometimes up to 4 shots of rum a day     History  Drug Use No    Social History   Social History  . Marital Status: Married    Spouse Name: N/A  . Number of Children: N/A  . Years of Education: N/A   Social History Main Topics  . Smoking status: Never Smoker   . Smokeless tobacco: Never Used  . Alcohol Use: Yes     Comment: daily sometimes up to 4 shots of rum a day  . Drug Use: No  . Sexual Activity: Yes   Other Topics Concern  . None   Social History Narrative   Additional Social History:     Musculoskeletal: Strength & Muscle Tone: within normal limits Gait & Station: normal Patient leans: N/A  Psychiatric Specialty Exam: Physical Exam  Constitutional: He is oriented to person, place, and time. He appears well-developed.  HENT:  Head: Normocephalic.  Eyes: Pupils are equal, round, and reactive to light.  Neck: Normal range of motion.  Respiratory: Effort normal.  Musculoskeletal: Normal range of motion.  Neurological: He is alert and oriented to person, place, and time.  Skin: Skin is warm and dry.  Incisions are clean and dry. Edges are healing well.   Psychiatric: His behavior is normal. Judgment and thought content normal.  Flat affect    Review of Systems  Psychiatric/Behavioral: Positive for depression and suicidal ideas. Negative for hallucinations and substance abuse. The patient is nervous/anxious and has insomnia.   All other systems reviewed and are negative.   Blood pressure 143/78, pulse 68, temperature 98.4 F (36.9 C), temperature source Oral, resp. rate 16, height 5' 11"  (1.803 m), weight 79.379 kg (175 lb), SpO2 100 %.Body mass index is 24.42 kg/(m^2).  General Appearance: Fairly Groomed  Engineer, water::  Fair  Speech:   Normal Rate and Pressured  Volume:  Normal  Mood:  Depressed  Affect:  Congruent and Flat  Thought Process:  Circumstantial and Intact  Orientation:  Full (Time, Place, and Person)  Thought Content:  WDL  Suicidal Thoughts:  No  Homicidal Thoughts:  No  Memory:  Immediate;   Good Recent;   Good Remote;   Good  Judgement:  Fair  Insight:  Present  Psychomotor Activity:  Increased  Concentration:  Good  Recall:  Abie of Knowledge:Fair  Language: Fair  Akathisia:  No  Handed:  Right  AIMS (if indicated):     Assets:  Communication Skills Desire for Improvement Financial Resources/Insurance Housing Leisure Time Physical Health Transportation Vocational/Educational  ADL's:  Intact  Cognition: WNL  Sleep:  Number of Hours: 6.25   Risk to Self: Is patient at risk for suicide?: Yes Risk to Others:   Prior Inpatient Therapy:   Prior Outpatient Therapy:    Alcohol Screening: 1. How often do you have a drink containing alcohol?: 4 or more times a week 2. How many drinks containing alcohol do you have on a typical day when you are drinking?: 3 or 4  3. How often do you have six or more drinks on one occasion?: Less than monthly Preliminary Score: 2 4. How often during the last year have you found that you were not able to stop drinking once you had started?: Never 5. How often during the last year have you failed to do what was normally expected from you becasue of drinking?: Never 6. How often during the last year have you needed a first drink in the morning to get yourself going after a heavy drinking session?: Never 7. How often during the last year have you had a feeling of guilt of remorse after drinking?: Never 8. How often during the last year have you been unable to remember what happened the night before because you had been drinking?: Never 9. Have you or someone else been injured as a result of your drinking?: No 10. Has a relative or friend or a doctor or another  health worker been concerned about your drinking or suggested you cut down?: No Alcohol Use Disorder Identification Test Final Score (AUDIT): 6 Brief Intervention: AUDIT score less than 7 or less-screening does not suggest unhealthy drinking-brief intervention not indicated  Allergies:   Allergies  Allergen Reactions  . Yellow Jacket Venom [Bee Venom] Swelling  . Percocet [Oxycodone-Acetaminophen] Nausea Only   Lab Results: No results found for this or any previous visit (from the past 48 hour(s)). Current Medications: Current Facility-Administered Medications  Medication Dose Route Frequency Provider Last Rate Last Dose  . alum & mag hydroxide-simeth (MAALOX/MYLANTA) 200-200-20 MG/5ML suspension 30 mL  30 mL Oral Q4H PRN Harriet Butte, NP      . amphetamine-dextroamphetamine (ADDERALL XR) 24 hr capsule 20 mg  20 mg Oral Daily Harriet Butte, NP   20 mg at 12/12/14 0826  . fenofibrate tablet 160 mg  160 mg Oral Daily Harriet Butte, NP   160 mg at 12/12/14 0825  . FLUoxetine (PROZAC) capsule 40 mg  40 mg Oral Daily Harriet Butte, NP   40 mg at 12/12/14 0825  . hydrOXYzine (ATARAX/VISTARIL) tablet 25 mg  25 mg Oral TID PRN Harriet Butte, NP      . Influenza vac split quadrivalent PF (FLUARIX) injection 0.5 mL  0.5 mL Intramuscular Tomorrow-1000 Encarnacion Slates, NP      . irbesartan (AVAPRO) tablet 37.5 mg  37.5 mg Oral Daily Harriet Butte, NP   37.5 mg at 12/12/14 0826  . magnesium hydroxide (MILK OF MAGNESIA) suspension 30 mL  30 mL Oral Daily PRN Harriet Butte, NP      . traZODone (DESYREL) tablet 50 mg  50 mg Oral QHS PRN Harriet Butte, NP   50 mg at 12/11/14 2147   PTA Medications: Prescriptions prior to admission  Medication Sig Dispense Refill Last Dose  . amphetamine-dextroamphetamine (ADDERALL XR) 20 MG 24 hr capsule Take 1 capsule (20 mg total) by mouth daily. 30 capsule 0 12/10/2014 at Unknown time  . fenofibrate 160 MG tablet TAKE 1 TABLET BY MOUTH ONCE DAILY (Patient  taking differently: Take 160 mg by mouth daily. ) 90 tablet 1 12/10/2014 at Unknown time  . FLUoxetine (PROZAC) 40 MG capsule Take 1 capsule (40 mg total) by mouth daily. 30 capsule 6 12/10/2014 at Unknown time  . valsartan (DIOVAN) 80 MG tablet Take 80 mg by mouth daily.   12/10/2014 at Unknown time  . HYDROcodone-acetaminophen (NORCO) 5-325 MG per tablet Take 1-2 tablets by mouth every 4 (four) hours as needed  for moderate pain. (Patient not taking: Reported on 12/10/2014) 50 tablet 0 Not Taking    Previous Psychotropic Medications: Ritalin Prozac  Substance Abuse History in the last 12 months:  No.    Consequences of Substance Abuse: NA  Results for orders placed or performed during the hospital encounter of 12/10/14 (from the past 72 hour(s))  Comprehensive metabolic panel     Status: Abnormal   Collection Time: 12/10/14  9:26 AM  Result Value Ref Range   Sodium 138 135 - 145 mmol/L   Potassium 3.8 3.5 - 5.1 mmol/L   Chloride 106 101 - 111 mmol/L   CO2 24 22 - 32 mmol/L   Glucose, Bld 102 (H) 65 - 99 mg/dL   BUN 10 6 - 20 mg/dL   Creatinine, Ser 0.95 0.61 - 1.24 mg/dL   Calcium 9.2 8.9 - 10.3 mg/dL   Total Protein 7.5 6.5 - 8.1 g/dL   Albumin 4.4 3.5 - 5.0 g/dL   AST 21 15 - 41 U/L   ALT 15 (L) 17 - 63 U/L   Alkaline Phosphatase 48 38 - 126 U/L   Total Bilirubin 0.5 0.3 - 1.2 mg/dL   GFR calc non Af Amer >60 >60 mL/min   GFR calc Af Amer >60 >60 mL/min    Comment: (NOTE) The eGFR has been calculated using the CKD EPI equation. This calculation has not been validated in all clinical situations. eGFR's persistently <60 mL/min signify possible Chronic Kidney Disease.    Anion gap 8 5 - 15  Ethanol (ETOH)     Status: None   Collection Time: 12/10/14  9:26 AM  Result Value Ref Range   Alcohol, Ethyl (B) <5 <5 mg/dL    Comment:        LOWEST DETECTABLE LIMIT FOR SERUM ALCOHOL IS 5 mg/dL FOR MEDICAL PURPOSES ONLY   Salicylate level     Status: None   Collection Time:  12/10/14  9:26 AM  Result Value Ref Range   Salicylate Lvl <6.4 2.8 - 30.0 mg/dL  Acetaminophen level     Status: Abnormal   Collection Time: 12/10/14  9:26 AM  Result Value Ref Range   Acetaminophen (Tylenol), Serum <10 (L) 10 - 30 ug/mL    Comment:        THERAPEUTIC CONCENTRATIONS VARY SIGNIFICANTLY. A RANGE OF 10-30 ug/mL MAY BE AN EFFECTIVE CONCENTRATION FOR MANY PATIENTS. HOWEVER, SOME ARE BEST TREATED AT CONCENTRATIONS OUTSIDE THIS RANGE. ACETAMINOPHEN CONCENTRATIONS >150 ug/mL AT 4 HOURS AFTER INGESTION AND >50 ug/mL AT 12 HOURS AFTER INGESTION ARE OFTEN ASSOCIATED WITH TOXIC REACTIONS.   CBC     Status: Abnormal   Collection Time: 12/10/14  9:26 AM  Result Value Ref Range   WBC 5.1 4.0 - 10.5 K/uL   RBC 4.14 (L) 4.22 - 5.81 MIL/uL   Hemoglobin 12.7 (L) 13.0 - 17.0 g/dL   HCT 38.9 (L) 39.0 - 52.0 %   MCV 94.0 78.0 - 100.0 fL   MCH 30.7 26.0 - 34.0 pg   MCHC 32.6 30.0 - 36.0 g/dL   RDW 13.6 11.5 - 15.5 %   Platelets 279 150 - 400 K/uL  Urine rapid drug screen (hosp performed) (Not at [facility])     Status: Abnormal   Collection Time: 12/10/14  9:29 AM  Result Value Ref Range   Opiates NONE DETECTED NONE DETECTED   Cocaine NONE DETECTED NONE DETECTED   Benzodiazepines NONE DETECTED NONE DETECTED   Amphetamines POSITIVE (A) NONE DETECTED  Tetrahydrocannabinol NONE DETECTED NONE DETECTED   Barbiturates NONE DETECTED NONE DETECTED    Comment:        DRUG SCREEN FOR MEDICAL PURPOSES ONLY.  IF CONFIRMATION IS NEEDED FOR ANY PURPOSE, NOTIFY LAB WITHIN 5 DAYS.        LOWEST DETECTABLE LIMITS FOR URINE DRUG SCREEN Drug Class       Cutoff (ng/mL) Amphetamine      1000 Barbiturate      200 Benzodiazepine   390 Tricyclics       300 Opiates          300 Cocaine          300 THC              50     Observation Level/Precautions:  15 minute checks  Laboratory:  labs obtained from Ed have been reviewed, no other labs will be obtained at this time.   Psychotherapy:   Individual and group therapy  Medications:  See above  Consultations:  As needed  Discharge Concerns:  Safety   Estimated LOS:5-7 days   Other:     Psychological Evaluations: Yes   Treatment Plan Summary: Daily contact with patient to assess and evaluate symptoms and progress in treatment and Medication management  Treatment Plan/Recommendations:  1 Admit for crisis management and stabilization. Estimated length of stay 5-7 days past his current stay of 1.  2 Individual and group therapy. 3 Medication management for depression, and anxiety to reduce current symptoms to base line and improve the overall levels of functioning: Medications reviewed with the patient and she stated no untoward effects, home medications in place.  4 Coping skills for depression and anxiety developing.  5 Continue crisis stabilization and management.  6 Address health issues- monitor vital signs, stable. 7 Treatment plan in progress to prevent relapse prevention and self care.  8 Psychosocial education regarding relapse prevention and self care 9 Heath care follow up as needed for any health concerns 10 Call for consult with hospitalist for additional specialty patient services as needed.   Medical Decision Making:  Review of Psycho-Social Stressors (1), Discuss test with performing physician (1), New Problem, with no additional work-up planned (3), Review of Last Therapy Session (1), Review of Medication Regimen & Side Effects (2) and Review of New Medication or Change in Dosage (2)  I certify that inpatient services furnished can reasonably be expected to improve the patient's condition.   Nanci Pina 9/24/20165:11 PM   Patient seen face-to-face for the psychiatric evaluation, case discussed with physician extender, completed admission suicide risk assessment and formulated treatment plan. Reviewed the information documented and agree with the treatment plan.  Zakiah Beckerman,JANARDHAHA  R. 12/13/2014 4:03 PM

## 2014-12-12 NOTE — Progress Notes (Signed)
Patient ID: Keith Johnson, male   DOB: 07/14/1975, 39 y.o.   MRN: [id number]   Adult Psychoeducational Group Note  Date:  12/12/2014 Time:  10:00am  Group Topic/Focus:  Identifying Needs:   The focus of this group is to help patients identify their personal needs that have been historically problematic and identify healthy behaviors to address their needs.  Participation Level:  Active  Participation Quality:  Appropriate  Affect:  Appropriate  Cognitive:  Appropriate  Insight: Lacking  Engagement in Group:  Defensive  Modes of Intervention:  Activity, Discussion, Education and Support  Additional Comments:  Pt able to identify current energy level.   Aurora Mask 12/12/2014, 10:58am

## 2014-12-12 NOTE — Progress Notes (Signed)
D.  Pt pleasant on approach, denies complaints at this time.  Positive for evening wrap up group with appropriate participation.  Interacting appropriately with peers on the unit.  Denies SI/HI/hallucinatons at this time.  A.  Support and encouragement offered.  R.  Pt remains safe on the unit, will continue to monitor.

## 2014-12-12 NOTE — [facility] Group Notes (Signed)
[facility] LCSW Group Therapy  12/12/2014   11:10 AM  Type of Therapy:  Group Therapy  Participation Level:  Minimal  Participation Quality:  Appropriate  Affect:  Appropriate  Cognitive:  Oriented  Insight:  Developing/Improving  Engagement in Therapy:  Developing/Improving  Modes of Intervention:  Discussion, Exploration, Rapport Building, Socialization and Support  Summary of Progress/Problems:  Summary of Progress/Problems: The main focus of today's process group was for the patient to identify ways in which they have in the past sabotaged their own recovery. Motivational Interviewing was utilized to ask the group members what they get out of their substance use, and what reasons they may have for wanting to change. The Stages of Change were explained using a handout, and patients identified where they currently are with regard to stages of change. The patient expressed that he "deals with a chemical imbalance that encourages self sabotage."   Patient was attentive and appeared interested in discussion and challenged another group member on avoidance yet otherwise remained quiet.     Carney Bern, LCSW

## 2014-12-12 NOTE — [facility] Suicide Risk Assessment (Signed)
[facility] Admission Suicide Risk Assessment   Nursing information obtained from:  Patient Demographic factors:  Male, Caucasian Current Mental Status:  NA Loss Factors:  Loss of significant relationship Historical Factors:  Prior suicide attempts Risk Reduction Factors:  Positive social support Total Time spent with patient: 45 minutes Principal Problem: <principal problem not specified> Diagnosis:   Patient Active Problem List   Diagnosis Date Noted  . 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 Suicide attempt [T14.91] 12/11/2014  . Physical exam [Z00.00] 08/19/2014  . Anxiety state [F41.1] 08/19/2014  . Allergic rhinitis [J30.9] 09/10/2013  . Sinusitis, acute frontal [J01.10] 08/01/2013  . Cerumen impaction [H61.20] 08/01/2013  . HTN (hypertension) [I10] 06/02/2013  . Hypertriglyceridemia [E78.1] 06/02/2013  . Rib pain on right side [R07.81] 10/09/2012  . ADD (attention deficit disorder) [F90.9] 08/05/2012  . SHOULDER PAIN, RIGHT [M25.519] 10/15/2008  . KNEE PAIN, RIGHT [M25.569] 10/15/2008  . THUMB PAIN, LEFT [M79.609] 10/15/2008     Continued Clinical Symptoms:  Alcohol Use Disorder Identification Test Final Score (AUDIT): 6 The "Alcohol Use Disorders Identification Test", Guidelines for Use in Primary Care, Second Edition.  World Science writer [facility]). Score between 0-7:  no or low risk or alcohol related problems. Score between 8-15:  moderate risk of alcohol related problems. Score between 16-19:  high risk of alcohol related problems. Score 20 or above:  warrants further diagnostic evaluation for alcohol dependence and treatment.   CLINICAL FACTORS:   Severe Anxiety and/or Agitation Depression:   Anhedonia Hopelessness Impulsivity Insomnia Recent sense of peace/wellbeing Severe Alcohol/Substance Abuse/Dependencies Unstable or Poor Therapeutic Relationship Previous Psychiatric Diagnoses and  Treatments   Musculoskeletal: Strength & Muscle Tone: within normal limits Gait & Station: normal Patient leans: N/A  Psychiatric Specialty Exam: Physical Exam Full physical performed in Emergency Department. I have reviewed this assessment and concur with its findings.   ROS deep fresh lacerations on his both wrists, denied nausea, vomiting, chest pain and shortness of breath No Fever-chills, No Headache, No changes with Vision or hearing, reports vertigo No problems swallowing food or Liquids, No Chest pain, Cough or Shortness of Breath, No Abdominal pain, No Nausea or Vommitting, Bowel movements are regular, No Blood in stool or Urine, No dysuria, No new skin rashes or bruises, No new joints pains-aches,  No new weakness, tingling, numbness in any extremity, No recent weight gain or loss, No polyuria, polydypsia or polyphagia,  A full 10 point Review of Systems was done, except as stated above, all other Review of Systems were negative.  Blood pressure 143/78, pulse 68, temperature 98.4 F (36.9 C), temperature source Oral, resp. rate 16, height  (1.803 m), weight 79.379 kg (175 lb), SpO2 100 %.Body mass index is 24.42 kg/(m^2).  General Appearance: Guarded  Eye Contact::  Good  Speech:  Clear and Coherent  Volume:  Normal  Mood:  Anxious and Depressed  Affect:  Appropriate and Congruent  Thought Process:  Coherent and Goal Directed  Orientation:  Full (Time, Place, and Person)  Thought Content:  WDL  Suicidal Thoughts:  Yes.  with intent/plan  Homicidal Thoughts:  No  Memory:  Immediate;   Good Recent;   Good  Judgement:  Impaired  Insight:  Shallow  Psychomotor Activity:  Decreased  Concentration:  Fair  Recall:  Good  Fund of Knowledge:Good  Language: Good  Akathisia:  Negative  Handed:  Ambidextrous  AIMS (if indicated):     Assets:  Communication Skills Desire for Improvement Financial Resources/Insurance Housing  Leisure Time Physical  Health Resilience Social Support Talents/Skills Transportation Vocational/Educational  Sleep:  Number of Hours: 6.25  Cognition: WNL  ADL's:  Intact     COGNITIVE FEATURES THAT CONTRIBUTE TO RISK:  Closed-mindedness, Loss of executive function, Polarized thinking and Thought constriction (tunnel vision)    SUICIDE RISK:   Severe:  Frequent, intense, and enduring suicidal ideation, specific plan, no subjective intent, but some objective markers of intent (i.e., choice of lethal method), the method is accessible, some limited preparatory behavior, evidence of impaired self-control, severe dysphoria/symptomatology, multiple risk factors present, and few if any protective factors, particularly a lack of social support.  PLAN OF CARE: Admit for increased symptoms of depression, anxiety and status post suicidal attempt by cutting his bilateral wrist with a razor blade behind the house with intent to end his life and pain. He needs crisis stabilization, safety monitoring and medication management while in the hospital and also benefited from the outpatient medication management and counseling services.  Medical Decision Making:  New problem, with additional work up planned, Review of Psycho-Social Stressors (1), Review or order clinical lab tests (1), Established Problem, Worsening (2), Review of Last Therapy Session (1), Review or order medicine tests (1), Review of Medication Regimen & Side Effects (2) and Review of New Medication or Change in Dosage (2)  I certify that inpatient services furnished can reasonably be expected to improve the patient's condition.   JONNALAGADDA,JANARDHAHA R. 12/12/2014, 3:31 PM

## 2014-12-13 DIAGNOSIS — F332 Major depressive disorder, recurrent severe without psychotic features: Principal | ICD-10-CM

## 2014-12-13 MED ORDER — LURASIDONE HCL 40 MG PO TABS
40.0000 mg | ORAL_TABLET | Freq: Every day | ORAL | Status: DC
  Administered 2014-12-14 – 2014-12-16 (×3): 40 mg via ORAL
  Filled 2014-12-13 (×5): qty 1

## 2014-12-13 NOTE — [facility] Group Notes (Signed)
[facility] LCSW Group Therapy  12/13/2014   1:15 PM   Type of Therapy:  Group Therapy  Participation Level: Minimal   Participation Quality:  Minimal  Affect:  Appropriate, calm  Cognitive:  Alert and Appropriate  Insight:  Limited, lacking  Engagement in Therapy:  Limited, lacking   Modes of Intervention:  Clarification, Confrontation, Discussion, Education, Exploration, Limit-setting, Orientation, Problem-solving, Rapport Building, Dance movement psychotherapist, Socialization and Support  Summary of Progress/Problems: The main focus of today's process group was to identify the patient's current support system and decide on other supports that can be put in place.  An emphasis was placed on using counselor, doctor, therapy groups, 12-step groups, and problem-specific support groups to expand supports, as well as doing something different than has been done before. Pt shared that he has good support.  Pt did not want to elaborate or talk further. Pt appeared to actively listen to group discussion.    Chelsea Horton, LCSW 12/13/2014 2:30 pm

## 2014-12-13 NOTE — [facility] Group Notes (Signed)
[facility] Group Notes:  (Nursing/MHT/Case Management/Adjunct)  Date:  12/13/2014  Time:  10:27 AM  Type of Therapy:  Psychoeducational Skills  Participation Level:  Active  Participation Quality:  Appropriate  Affect:  Appropriate  Cognitive:  Appropriate  Insight:  Appropriate  Engagement in Group:  Engaged  Modes of Intervention:  Discussion  Summary of Progress/Problems: Pt did attend healthy support systems group.  Forrest, Shalita Shanta 12/13/2014, 10:27 AM 

## 2014-12-13 NOTE — Progress Notes (Deleted)
Keith Johnson is working  On his recovery as evidenced by his presence and participation in his groups, his rpesence in the dayroom during leisure time and his willingness to open up to staff ...about his anger and his issues.   A He completed his daily assessment and on it he wrote he denied SI  Today and he rated his depression, hopelessness and anxiety " 0/0/3" , respectively. He had shared earlier this AM that he was hoping the physician would dc him today, and he was disappointed when he didn't get to go home....but he is able to say that he knows he will go tomorrow and he chooses to stay focused on his " recovery",. He makes good eye contact. He has insight into his behavior. HE articulates he wants to learn healthier coping skills. '   R Safety is in place.

## 2014-12-13 NOTE — [facility] Counselor (Signed)
Adult Comprehensive Assessment  Patient ID: Keith Johnson, male   DOB: 1975-03-21, 39 y.o.   MRN: [id number]  Information Source: Information source: Patient  Current Stressors:  Educational / Learning stressors: NA Employment / Job issues: Work stress related to responsibilities Family Relationships: Recent separation from wife of 6 years Surveyor, quantity / Lack of resources (include bankruptcy): NA Housing / Lack of housing: NA Physical health (include injuries & life threatening diseases): Increased depression and HTP Social relationships: NA Substance abuse: Pt reports he only uses alcohol in order to sleep Bereavement / Loss: NA  Living/Environment/Situation:  Living Arrangements: Alone Living conditions (as described by patient or guardian): Comfortable home pt shares with 2 of his 3 children; wife recently moved out How long has patient lived in current situation?: 4 years What is atmosphere in current home: Comfortable, Paramedic, Supportive  Family History:  Marital status: Separated Separated, when?: 3-4 weeks ago separation occurred in marriage of 6 years What types of issues is patient dealing with in the relationship?: Both parties reportedly have anger issues and often clash Additional relationship information: Ex wife has custody of 26 YO currently Does patient have children?: Yes How many children?: 3 How is patient's relationship with their children?: Good with all 3; pt sees 72 YO who is with ex wife on regular basis  Childhood History:  By whom was/is the patient raised?: Both parents Additional childhood history information: Good childhood Description of patient's relationship with caregiver when they were a child: Good with both Patient's description of current relationship with people who raised him/her: Pt has been assisting mom who had a stroke last year; good with father Does patient have siblings?: Yes Number of Siblings: 2 Description of patient's current  relationship with siblings: Infrequent yet regular contact Did patient suffer any verbal/emotional/physical/sexual abuse as a child?: No Did patient suffer from severe childhood neglect?: No Has patient ever been sexually abused/assaulted/raped as an adolescent or adult?: No Was the patient ever a victim of a crime or a disaster?: Yes Patient description of being a victim of a crime or disaster: Pt has been present for a stranger's death and health emergencies of several strangers Witnessed domestic violence?: No Has patient been effected by domestic violence as an adult?: No  Education:  Highest grade of school patient has completed: 12 Currently a student?: No Learning disability?: No  Employment/Work Situation:   Employment situation: Employed Where is patient currently employed?: [employer]  How long has patient been employed?: 17 years (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) Patient's job has been impacted by current illness: No What is the longest time patient has a held a job?: 17 years  Where was the patient employed at that time?: Current Job Has patient ever been in the [employer]?: No Has patient ever served in Buyer, retail?: No  Financial Resources:   Financial resources: Income from employment  Alcohol/Substance Abuse:   What has been your use of drugs/alcohol within the last 12 months?: Pt reports he occasionally uses alcohol to get to sleep but does not like the feeling of intoxication so limits consumption If attempted suicide, did drugs/alcohol play a role in this?: No Alcohol/Substance Abuse Treatment Hx: Denies past history Has alcohol/substance abuse ever caused legal problems?: No  Social Support System:   Patient's Community Support System: Good Describe Community Support System: Friends, family Type of faith/religion: NA  Leisure/Recreation:   Leisure and Hobbies: Careers adviser  Strengths/Needs:   What things does the patient do  well?: Primary school teacher,  Curator, good parent In what areas does patient struggle / problems for patient: Depression, relationship issues, rage and anger  Discharge Plan:   Does patient have access to transportation?: Yes Will patient be returning to same living situation after discharge?: Yes Currently receiving community mental health services: Yes (From Whom) (From PCP Dr Jonathon Bellows; open to therapist at [facility] ) If no, would patient like referral for services when discharged?: Yes (What county?) (open to therapist at [facility] ) Does patient have financial barriers related to discharge medications?: Yes Patient description of barriers related to discharge medications: NA  Summary/Recommendations:   Summary and Recommendations (to be completed by the evaluator): Pt is 39 YO recently separated caucasian male admitted with Major Depressive Disorder, Single Episode following bilateral cuts to wrists in suicide attempt. Patient recently separated from wife of 6 years, has stressful job (w long term employment) and reports history of self harm (none for last 5 years) although pt reports desire is returning. Patient open to referral for counseling at [facility] and will see PCP for med mgt.  Patient would benefit from crisis stabilization, medication evaluation, therapy groups for processing thoughts/feelings/experiences, psycho ed groups for increasing coping skills, and aftercare planning. Discharge Process and Patient Expectations information sheet signed by patient, witnessed by Clinical research associate.    Clide Dales. 12/13/2014

## 2014-12-13 NOTE — Progress Notes (Signed)
[name] cont to examine his behavior and how it relates to is feelings..  vise versa. HE is adept at articulating his feelings. He is trying more to be open and honest with himself about his problems.   A He completed his daily assessment and on it he rote he denied SI today and he rated his depression, hopelessness and anxiety " 2/0/7", respectively.    R Safety in place. Expecting DC Mon.

## 2014-12-13 NOTE — Progress Notes (Signed)
[facility] MD Progress Note  12/13/2014 2:54 PM DEOVION BATREZ  MRN:  [id number] Subjective:   Keith Johnson is a 39 y.o white male presents to the hospital with Suicide attempt by cuts to both wrists. Patient reports he contacted his exstraged wife after he cut his wrists. He states 4 months ago started having problem with anxiety and agitation was prescribed Prozac by his family doctor. He states the medication was working well but recently he starting getting depressed, feeling overwhelmed and stressed by work and his marriage/relationship. He reports his first marriage ended in divorce. He has 3 boys with first wife, the 67 year old and 39 year old who live with him and the 37 year old lives with his mom. He reports his 2nd marriage is failing and that is depressing. He reports he is currently separated from his second wife and reports that he desired the separation. He reports upsetting his exstraged wife got him depressed because "it is hard to rip myself away from people". He reports currently on Adderall 20 mg daily for ADHD. He report diagnosed with ADHD at 17 years. He reports he was prescribed Ritalin which he used till age 51 year. Patient is also hypertensive and on medication. He denies ever receiving outpatient counseling or inpatient treatment. According to Patient "I do not believe in therapy".    Patient  has been compliant with his medication and inpatient psychiatric program including milieu therapy and group therapy. Patient  has a disturbance of sleep and appetite. Patient has rates depression  1/10 and anxiety 7-8/10.Denies any thoughts of SI/HI/AVH.  Principal Problem: <principal problem not specified> Diagnosis:   Patient Active Problem List   Diagnosis Date Noted  . 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 Suicide attempt [T14.91] 12/11/2014  . Physical exam [Z00.00] 08/19/2014  . Anxiety state [F41.1] 08/19/2014  . Allergic  rhinitis [J30.9] 09/10/2013  . Sinusitis, acute frontal [J01.10] 08/01/2013  . Cerumen impaction [H61.20] 08/01/2013  . HTN (hypertension) [I10] 06/02/2013  . Hypertriglyceridemia [E78.1] 06/02/2013  . Rib pain on right side [R07.81] 10/09/2012  . ADD (attention deficit disorder) [F90.9] 08/05/2012  . SHOULDER PAIN, RIGHT [M25.519] 10/15/2008  . KNEE PAIN, RIGHT [M25.569] 10/15/2008  . THUMB PAIN, LEFT [M79.609] 10/15/2008   Total Time spent with patient: 20 minutes   Past Medical History:  Past Medical History  Diagnosis Date  . ADD (attention deficit disorder)   . Hyperlipidemia   . Hypertension   . Pneumonia   . Anxiety   . Complication of anesthesia   . PONV (postoperative nausea and vomiting)   . Kidney stones     Past Surgical History  Procedure Laterality Date  . Orthopedic surgery      right clavicle  . Orif clavicular fracture Left 09/10/2014    Procedure: OPEN REDUCTION INTERNAL FIXATION (ORIF) CLAVICULAR FRACTURE;  Surgeon: Jones Broom, MD;  Location: [facility] OR;  Service: Orthopedics;  Laterality: Left;  Left Open reduction internal fixatin clavical  . Orif clavicular fracture Left 10/22/2014    Procedure: OPEN REDUCTION INTERNAL FIXATION (ORIF) CLAVICULAR FRACTURE, HARDWARE REMOVAL ;  Surgeon: Jones Broom, MD;  Location: [facility];  Service: Orthopedics;  Laterality: Left;  Open reduction internal fixation left clavical fracture with left clavical hardware removal   Family History:  Family History  Problem Relation Age of Onset  . Stroke Mother   . Heart attack Maternal Grandfather   . Heart attack Paternal Grandmother   . Stroke  Paternal Grandmother   . Fibromyalgia Father    Social History:  History  Alcohol Use  . Yes    Comment: daily sometimes up to 4 shots of rum a day     History  Drug Use No    Social History   Social History  . Marital Status: Married    Spouse Name: N/A  . Number of Children: N/A  . Years of Education:  N/A   Social History Main Topics  . Smoking status: Never Smoker   . Smokeless tobacco: Never Used  . Alcohol Use: Yes     Comment: daily sometimes up to 4 shots of rum a day  . Drug Use: No  . Sexual Activity: Yes   Other Topics Concern  . None   Social History Narrative   Additional History:    Sleep: Fair  Appetite:  Fair   Assessment:   Musculoskeletal: Strength & Muscle Tone: within normal limits Gait & Station: normal Patient leans: N/A   Psychiatric Specialty Exam: Physical Exam  Review of Systems  All other systems reviewed and are negative.   Blood pressure 149/76, pulse 74, temperature 98 F (36.7 C), temperature source Oral, resp. rate 16, height  (1.803 m), weight 79.379 kg (175 lb), SpO2 100 %.Body mass index is 24.42 kg/(m^2).  General Appearance: Fairly Groomed  Patent attorney::  Good  Speech:  Clear and Coherent and Normal Rate  Volume:  Normal  Mood:  Euthymic  Affect:  Depressed, Flat and Inappropriate  Thought Process:  Goal Directed, Intact and Linear  Orientation:  Full (Time, Place, and Person)  Thought Content:  WDL  Suicidal Thoughts:  No  Homicidal Thoughts:  No  Memory:  Negative  Judgement:  Intact  Insight:  Present  Psychomotor Activity:  Normal  Concentration:  Good  Recall:  Good  Fund of Knowledge:Good  Language: Good  Akathisia:  No  Handed:  Right  AIMS (if indicated):     Assets:  Communication Skills Desire for Improvement Financial Resources/Insurance Leisure Time Physical Health Resilience Social Support  ADL's:  Intact  Cognition: WNL  Sleep:  Number of Hours: 6.25     Current Medications: Current Facility-Administered Medications  Medication Dose Route Frequency Provider Last Rate Last Dose  . alum & mag hydroxide-simeth (MAALOX/MYLANTA) 200-200-20 MG/5ML suspension 30 mL  30 mL Oral Q4H PRN Worthy Flank, NP      . amphetamine-dextroamphetamine (ADDERALL XR) 24 hr capsule 20 mg  20 mg Oral Daily  Worthy Flank, NP   20 mg at 12/13/14 0739  . fenofibrate tablet 160 mg  160 mg Oral Daily Worthy Flank, NP   160 mg at 12/13/14 0737  . FLUoxetine (PROZAC) capsule 40 mg  40 mg Oral Daily Worthy Flank, NP   40 mg at 12/13/14 0737  . hydrOXYzine (ATARAX/VISTARIL) tablet 25 mg  25 mg Oral TID PRN Worthy Flank, NP      . Influenza vac split quadrivalent PF (FLUARIX) injection 0.5 mL  0.5 mL Intramuscular Tomorrow-1000 Sanjuana Kava, NP      . irbesartan (AVAPRO) tablet 37.5 mg  37.5 mg Oral Daily Worthy Flank, NP   37.5 mg at 12/13/14 0737  . magnesium hydroxide (MILK OF MAGNESIA) suspension 30 mL  30 mL Oral Daily PRN Worthy Flank, NP      . traZODone (DESYREL) tablet 50 mg  50 mg Oral QHS PRN Worthy Flank, NP   50 mg  at 12/12/14 2207    Lab Results: No results found for this or any previous visit (from the past 48 hour(s)).  Physical Findings: AIMS: Facial and Oral Movements Muscles of Facial Expression: None, normal Lips and Perioral Area: None, normal Jaw: None, normal Tongue: None, normal,Extremity Movements Upper (arms, wrists, hands, fingers): None, normal Lower (legs, knees, ankles, toes): None, normal, Trunk Movements Neck, shoulders, hips: None, normal, Overall Severity Severity of abnormal movements (highest score from questions above): None, normal Incapacitation due to abnormal movements: None, normal Patient's awareness of abnormal movements (rate only patient's report): No Awareness, Dental Status Current problems with teeth and/or dentures?: No Does patient usually wear dentures?: Yes  CIWA:  CIWA-Ar Total: 1 COWS:  COWS Total Score: 0  Treatment Plan Summary: Daily contact with patient to assess and evaluate symptoms and progress in treatment and Medication management   Plan: Review of chart, vital signs, medications, and notes.  1-Admit for crisis management and stabilization. Estimated length of stay 5-7 days past his current stay of  2 2-Individual and group therapy encouraged  3-Medication management for depression and anxiety to reduce current symptoms to base line and improve the patient's overall level of functioning:  Medications reviewed with the patient  Trazodone added for sleep  Vistaril 25 mg every six hours PRN anxiety Latuda  po daily for bipolar depressive.  4-Coping skills for depression, substance abuse, anger issues, and anxiety developing--  5-Continue crisis stabilization and management  6-Address health issues--monitoring vital signs, stable  7-Treatment plan in progress to prevent relapse of depression, angry outbursts, and anxiety  8-Psychosocial education regarding relapse prevention and self-care  9-Health care follow up as needed for any health concerns  10-Call for consult with hospitalist for additional specialty patient services as needed.   Medical Decision Making:  Established Problem, Stable/Improving (1), Review of Psycho-Social Stressors (1), Review or order clinical lab tests (1), Review of Medication Regimen & Side Effects (2) and Review of New Medication or Change in Dosage (2)   Malachy Chamber S FNP-BC 12/13/2014, 2:54 PM  Reviewed the information documented and agree with the treatment plan.  JONNALAGADDA,JANARDHAHA R. 12/15/2014 10:00 AM

## 2014-12-13 NOTE — Progress Notes (Signed)
Adult Psychoeducational Group Note  Date:  12/13/2014 Time:  11:11 PM  Group Topic/Focus:  Wrap-Up Group:   The focus of this group is to help patients review their daily goal of treatment and discuss progress on daily workbooks.  Participation Level:  Active  Participation Quality:  Appropriate and Attentive  Affect:  Appropriate  Cognitive:  Appropriate  Insight: Appropriate and Good  Engagement in Group:  Engaged  Modes of Intervention:  Education  Additional Comments:  Pt mentioned his healthy support system is his family and they make him laugh a lot and keep his mind on track.   Merlinda Frederick 12/13/2014, 11:11 PM

## 2014-12-13 NOTE — [facility] Group Notes (Signed)
[facility] Group Notes:  (Nursing/MHT/Case Management/Adjunct)  Date:  12/13/2014  Time:  10:05 AM  Type of Therapy:  Psychoeducational Skills  Participation Level:  Active  Participation Quality:  Appropriate  Affect:  Appropriate  Cognitive:  Appropriate  Insight:  Appropriate  Engagement in Group:  Engaged  Modes of Intervention:  Discussion  Summary of Progress/Problems: Pt did attend self inventory group.  Jacquelyne Balint Shanta 12/13/2014, 10:05 AM

## 2014-12-14 ENCOUNTER — Other Ambulatory Visit: Payer: Self-pay

## 2014-12-14 DIAGNOSIS — S61511A Laceration without foreign body of right wrist, initial encounter: Secondary | ICD-10-CM

## 2014-12-14 DIAGNOSIS — X789XXA Intentional self-harm by unspecified sharp object, initial encounter: Secondary | ICD-10-CM

## 2014-12-14 DIAGNOSIS — T1491 Suicide attempt: Secondary | ICD-10-CM

## 2014-12-14 DIAGNOSIS — S61512A Laceration without foreign body of left wrist, initial encounter: Secondary | ICD-10-CM

## 2014-12-14 MED ORDER — MIRTAZAPINE 15 MG PO TABS
15.0000 mg | ORAL_TABLET | Freq: Every day | ORAL | Status: DC
  Administered 2014-12-14 – 2014-12-15 (×2): 15 mg via ORAL
  Filled 2014-12-14 (×5): qty 1

## 2014-12-14 NOTE — Progress Notes (Signed)
Pt observed in the dayroom talking with peers and watching the TV.  He reports that his day was good.  He said that the MD made some more changes to his meds which he hopes will help even more.  He is getting frustrated with the silliness and disruptiveness of the other patients, and spends most of his time in the dayroom drawing in his sketch pad.  Pt denies SI/HI/AVH.  He reports that his family is supportive and he feels ready to go home.  Support and encouragement offered.  Pt makes his needs known to staff.  Safety maintained with q15 minute checks.

## 2014-12-14 NOTE — [facility] Group Notes (Signed)
Adult Psychoeducational Group Note  Date:  12/14/2014 Time:  9:39 PM  Group Topic/Focus:  Wrap-Up Group:   The focus of this group is to help patients review their daily goal of treatment and discuss progress on daily workbooks.  Participation Level:  Active  Participation Quality:  Appropriate  Affect:  Appropriate  Cognitive:  Appropriate  Insight: Good  Engagement in Group:  Engaged  Modes of Intervention:  Discussion  Additional Comments:  Patient expressed his day was good and the people made his day good.  His goal was to talk to the doctor.  Caroll Rancher A 12/14/2014, 9:39 PM

## 2014-12-14 NOTE — Progress Notes (Signed)
Pt reports that he is doing well and feels the medications prescribed for him are helping.  He has been observed in the dayroom with a sketch book, drawing.  He attended evening group tonight.  He denies SI/HI/AVH and feels he is about ready to discharge home.  He makes his needs known to staff.  Support and encouragement offered.  Safety maintained with q15 minute checks.

## 2014-12-14 NOTE — Progress Notes (Signed)
Patient ID: Keith Johnson, male   DOB: [birth year], 39 y.o.   MRN: [id number]  DAR: Pt. Denies SI/HI and A/V Hallucinations however reports his anxiety changes throughout the day depending on who he is around. When writer inquired about it he said, "some of the inappropriateness on the hall" referring to another person cursing. Patient however remains calm and pleasant with this Clinical research associate. He reports his sleep last night was good, appetite is good, energy level is normal, and concentration is good. He rates his depression 3/10, hopelessness 0/10, and anxiety 4/10. Patient does not report any pain or discomfort at this time. Support and encouragement provided to the patient. Scheduled medications administered to patient per physician's orders. Patient is receptive and cooperative. EKG done and placed on chart. Q15 minute checks are maintained for safety.

## 2014-12-14 NOTE — [facility] Group Notes (Signed)
[facility] LCSW Aftercare Discharge Planning Group Note  12/14/2014 8:45 AM  Participation Quality: Alert, Appropriate and Oriented  Mood/Affect: Flat  Depression Rating: 4  Anxiety Rating: 2 "after my pill"  Thoughts of Suicide: Pt denies SI/HI  Will you contract for safety? Yes  Current AVH: Pt denies  Plan for Discharge/Comments: Pt attended discharge planning group and actively participated in group. CSW discussed suicide prevention education with the group and encouraged them to discuss discharge planning and any relevant barriers. Pt was minimal when providing information. Reports that he would like a list of therapists so he could set it up after discharge. Pt sees PCP for medications.  Transportation Means: Pt reports access to transportation  Supports: No supports mentioned at this time  Chad Cordial, LCSWA 12/14/2014 9:50 AM

## 2014-12-14 NOTE — Plan of Care (Signed)
Problem: Diagnosis: Increased Risk For Suicide Attempt Goal: STG-Patient Will Comply With Medication Regime Outcome: Progressing [place] is compliant with medication regime at this time.

## 2014-12-14 NOTE — [facility] Group Notes (Signed)
[facility] LCSW Group Therapy  12/14/2014 1:15pm  Type of Therapy:  Group Therapy vercoming Obstacles  Participation Level:  Active  Participation Quality:  Vague  Affect:  Appropriate  Cognitive:  Appropriate and Oriented  Insight:  Limited  Engagement in Therapy:  Improving  Modes of Intervention:  Discussion, Exploration, Problem-solving and Support  Description of Group:   In this group patients will be encouraged to explore what they see as obstacles to their own wellness and recovery. They will be guided to discuss their thoughts, feelings, and behaviors related to these obstacles. The group will process together ways to cope with barriers, with attention given to specific choices patients can make. Each patient will be challenged to identify changes they are motivated to make in order to overcome their obstacles. This group will be process-oriented, with patients participating in exploration of their own experiences as well as giving and receiving support and challenge from other group members.  Summary of Patient Progress: Pt participated voluntarily in group discussion, however his comments were often vague and he declined to elaborate when prompted. He identified feeling fearful of the unpredictability of his anger and his concern with his inability to regulate his depressive symptoms.   Therapeutic Modalities:   Cognitive Behavioral Therapy Solution Focused Therapy Motivational Interviewing Relapse Prevention Therapy   Chad Cordial, LCSWA 12/14/2014 5:11 PM

## 2014-12-14 NOTE — Progress Notes (Signed)
Patient ID: Keith Johnson, male   DOB: 1975-10-21, 39 y.o.   MRN: [id number] [facility] MD Progress Note  12/14/2014 6:12 PM TYHEIM VANALSTYNE  MRN:  [id number] Subjective:   Patient reports he is feeling better, although still depressed . States he has a history of depression, which has been going on " on and off " for a long time, but which he states he has tended to minimize . He has been on Prozac for a period of several months/ year, but feels it has not been helping significantly, particularly lately . In addition to depression, however, he reports that he has a history of mood swings, episodes of anger / explosiveness . He reports history of ADHD for which he has been on one or another type of stimulant for many years . States that he does not feel Adderall  Either causes or contributes to his mood swings  And that without it he feels more distractible , has difficulty functioning in his daily activities due to it.  Objective:  I have discussed case with treatment team and have met with patient. Patient presents calm, cooperative, reports some ongoing depression, although improved compared to admission. He does report ongoing anxiety, and subjective sense of mood lability. No disruptive behaviors on unit. Some group participation. Patient reports wrist cuts are healing well, denies pain or supuration.    .  Principal Problem:  Suicide Attempt / Diagnosis:   Patient Active Problem List   Diagnosis Date Noted  . 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 Suicide attempt [T14.91] 12/11/2014  . Physical exam [Z00.00] 08/19/2014  . Anxiety state [F41.1] 08/19/2014  . Allergic rhinitis [J30.9] 09/10/2013  . Sinusitis, acute frontal [J01.10] 08/01/2013  . Cerumen impaction [H61.20] 08/01/2013  . HTN (hypertension) [I10] 06/02/2013  . Hypertriglyceridemia [E78.1] 06/02/2013  . Rib pain on right side [R07.81] 10/09/2012  . ADD (attention deficit  disorder) [F90.9] 08/05/2012  . SHOULDER PAIN, RIGHT [M25.519] 10/15/2008  . KNEE PAIN, RIGHT [M25.569] 10/15/2008  . THUMB PAIN, LEFT [M79.609] 10/15/2008   Total Time spent with patient: 25 minutes    Past Medical History:  Past Medical History  Diagnosis Date  . ADD (attention deficit disorder)   . Hyperlipidemia   . Hypertension   . Pneumonia   . Anxiety   . Complication of anesthesia   . PONV (postoperative nausea and vomiting)   . Kidney stones     Past Surgical History  Procedure Laterality Date  . Orthopedic surgery      right clavicle  . Orif clavicular fracture Left 09/10/2014    Procedure: OPEN REDUCTION INTERNAL FIXATION (ORIF) CLAVICULAR FRACTURE;  Surgeon: Tania Ade, MD;  Location: [place];  Service: Orthopedics;  Laterality: Left;  Left Open reduction internal fixatin clavical  . Orif clavicular fracture Left 10/22/2014    Procedure: OPEN REDUCTION INTERNAL FIXATION (ORIF) CLAVICULAR FRACTURE, HARDWARE REMOVAL ;  Surgeon: Tania Ade, MD;  Location: [place];  Service: Orthopedics;  Laterality: Left;  Open reduction internal fixation left clavical fracture with left clavical hardware removal   Family History:  Family History  Problem Relation Age of Onset  . Stroke Mother   . Heart attack Maternal Grandfather   . Heart attack Paternal Grandmother   . Stroke Paternal Grandmother   . Fibromyalgia Father    Social History:  History  Alcohol Use  . Yes    Comment: daily sometimes up to 4 shots of rum a day  History  Drug Use No    Social History   Social History  . Marital Status: Married    Spouse Name: N/A  . Number of Children: N/A  . Years of Education: N/A   Social History Main Topics  . Smoking status: Never Smoker   . Smokeless tobacco: Never Used  . Alcohol Use: Yes     Comment: daily sometimes up to 4 shots of rum a day  . Drug Use: No  . Sexual Activity: Yes   Other Topics Concern  . None   Social History  Narrative   Additional History:    Sleep: Fair  Appetite:   Good    Assessment:   Musculoskeletal: Strength & Muscle Tone: within normal limits Gait & Station: normal Patient leans: N/A   Psychiatric Specialty Exam: Physical Exam  Review of Systems  All other systems reviewed and are negative. denies chest pain, denies shortness of breath, denies vomiting  Blood pressure 118/75, pulse 76, temperature 98.2 F (36.8 C), temperature source Oral, resp. rate 18, height 5' 11"  (1.803 m), weight 175 lb (79.379 kg), SpO2 100 %.Body mass index is 24.42 kg/(m^2).  General Appearance: Well Groomed  Engineer, water::  Good  Speech:  Clear and Coherent and Normal Rate  Volume:  Normal  Mood:   Describes improvement compared to admission  Affect:   Reactive, vaguely/subtly irritable  Thought Process:  Goal Directed, Intact and Linear  Orientation:  Full (Time, Place, and Person)  Thought Content:   Denies hallucinations, no delusions   Suicidal Thoughts:  No at this time denies any self injurious ideations or any thoughts of SI, denies any thoughts of hurting anyone else   Homicidal Thoughts:  No  Memory:  Negative  Judgement:  Intact  Insight:  Present  Psychomotor Activity:  Normal  Concentration:  Good  Recall:  Good  Fund of Knowledge:Good  Language: Good  Akathisia:  No  Handed:  Right  AIMS (if indicated):     Assets:  Communication Skills Desire for Improvement Financial Resources/Insurance Leisure Time Physical Health Resilience Social Support  ADL's:  Intact  Cognition: WNL  Sleep:  Number of Hours: 5.5     Current Medications: Current Facility-Administered Medications  Medication Dose Route Frequency Provider Last Rate Last Dose  . alum & mag hydroxide-simeth (MAALOX/MYLANTA) 200-200-20 MG/5ML suspension 30 mL  30 mL Oral Q4H PRN Harriet Butte, NP      . amphetamine-dextroamphetamine (ADDERALL XR) 24 hr capsule 20 mg  20 mg Oral Daily Harriet Butte, NP   20  mg at 12/14/14 0826  . fenofibrate tablet 160 mg  160 mg Oral Daily Harriet Butte, NP   160 mg at 12/14/14 6962  . hydrOXYzine (ATARAX/VISTARIL) tablet 25 mg  25 mg Oral TID PRN Harriet Butte, NP      . irbesartan (AVAPRO) tablet 37.5 mg  37.5 mg Oral Daily Harriet Butte, NP   37.5 mg at 12/14/14 0826  . lurasidone (LATUDA) tablet 40 mg  40 mg Oral Q breakfast Nanci Pina, FNP   40 mg at 12/14/14 0826  . magnesium hydroxide (MILK OF MAGNESIA) suspension 30 mL  30 mL Oral Daily PRN Harriet Butte, NP      . mirtazapine (REMERON) tablet 15 mg  15 mg Oral QHS Jenne Campus, MD        Lab Results: No results found for this or any previous visit (from the past 48 hour(s)).  Physical  Findings: AIMS: Facial and Oral Movements Muscles of Facial Expression: None, normal Lips and Perioral Area: None, normal Jaw: None, normal Tongue: None, normal,Extremity Movements Upper (arms, wrists, hands, fingers): None, normal Lower (legs, knees, ankles, toes): None, normal, Trunk Movements Neck, shoulders, hips: None, normal, Overall Severity Severity of abnormal movements (highest score from questions above): None, normal Incapacitation due to abnormal movements: None, normal Patient's awareness of abnormal movements (rate only patient's report): No Awareness, Dental Status Current problems with teeth and/or dentures?: No Does patient usually wear dentures?: Yes  CIWA:  CIWA-Ar Total: 1 COWS:  COWS Total Score: 0   Assessment - patient reports history of mood disorder, and endorses not only depression but also significant mood lability/swings, and episodes of severe irritability, which have caused disruption to relationships, employment in the past . At this time partially improved , but reports some ongoing mood lability/anxiety. Thus far tolerating Latuda well . Of note, reports little response to Prozac, which he has been on for several months . We discussed options- agrees to taper off  Prozac , as not felt to be helping, and to add Remeron to address insomnia, depression.  We have reviewed potential for stimulant medications such as Adderall to contribute to irritability, mood instability- however, patient states he has been on this medication for a long period of time and does not do well off it .   Treatment Plan Summary: Daily contact with patient to assess and evaluate symptoms and progress in treatment and Medication management   Plan. Continue inpatient treatment and continue to encourage group participation/milieu to address coping skills and help with symptom reduction D/C Prozac - see above  Continue Latuda 40 mgrs QDAY for mood stabilization Continue Adderall XL  20 mgrs QDAY for history of ADHD  Continue Vistaril 25 mgrs Q 8 hours PRN for Anxiety Start Remeron 15 mgrs QHS for Insomnia and Depression Obtain EGK, HgbA1C, and Lipid Panel- routine due to being on antipsychotic medication   Medical Decision Making:  Established Problem, Stable/Improving (1), Review of Psycho-Social Stressors (1), Review or order clinical lab tests (1), Review of Medication Regimen & Side Effects (2) and Review of New Medication or Change in Dosage (2)   Neita Garnet , MD 12/14/2014, 6:12 PM

## 2014-12-14 NOTE — Progress Notes (Signed)
Recreation Therapy Notes  Date: 09.26.2016 Time: 9:30am Location: [address] Group Room   Group Topic: Stress Management  Goal Area(s) Addresses:  Patient will actively participate in stress management techniques presented during session.   Behavioral Response: Did not attend.   Denise L Blanchfield, LRT/CTRS        Blanchfield, Denise L 12/14/2014 1:19 PM 

## 2014-12-14 NOTE — Progress Notes (Signed)
Patient ID: Keith Johnson, male   DOB: [birth year], 40 y.o.   MRN: [id number]  EKG completed. Pt tolerated well. MD notified of results.

## 2014-12-15 NOTE — Progress Notes (Signed)
D: Pt is alert and oriented x 4. Denies any form of depression, anxiety, pain, SI, HI and AVH. Pt affect however, show some anxiety, he states, "I was anxious earlier in the day after talking to my wife, but I feel 100% Ok now." Pt continue to be optimistic.  A: Medications offered as prescribed.  Pt attended wrap-up group. Support, encouragement, and safe environment provided.  15-minute safety checks continue.  R: Pt was med compliant.  Pt attended group. Safety checks continue.

## 2014-12-15 NOTE — Progress Notes (Signed)
Patient ID: Keith Johnson, male   DOB: Dec 27, 1975, 39 y.o.   MRN: [id number] [facility] MD Progress Note  12/15/2014 4:23 PM Keith Johnson  MRN:  [id number] Subjective:   Patient reports he is feeling " better" than when he was initially admitted . He reports improved overall mood . At this time does not endorse medication side effects.   Objective:  I have discussed case with treatment team and have met with patient. He is visible on unit, going to groups, interacting with selected peers . Behavior calm , no disruptive or agitated behaviors on unit .  At this time more future oriented, and starting to focus more on disposition planning . He plans to return to work soon after discharge . Patient denies any current self injurious or suicidal ideations. (Today reports history of relatively frequent episodes of self burning, states he does not really have a specific  Explanation as to the reason for this behavior, sometimes it is to distract from emotional pain, and sometimes " it is for the adrenalin rush I feel ".) States he has no thoughts of self burning  At this time and is working on establishing better mechanisms to deal with anxiety, emotions. No medication side effects. Overall affect seems improved compared to prior, at this time not irritable , not dysphoric .      Marland Kitchen  Principal Problem:  Suicide Attempt / Diagnosis:   Patient Active Problem List   Diagnosis Date Noted  . 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 Suicide attempt [T14.91] 12/11/2014  . Physical exam [Z00.00] 08/19/2014  . Anxiety state [F41.1] 08/19/2014  . Allergic rhinitis [J30.9] 09/10/2013  . Sinusitis, acute frontal [J01.10] 08/01/2013  . Cerumen impaction [H61.20] 08/01/2013  . HTN (hypertension) [I10] 06/02/2013  . Hypertriglyceridemia [E78.1] 06/02/2013  . Rib pain on right side [R07.81] 10/09/2012  . ADD (attention deficit disorder) [F90.9] 08/05/2012  .  SHOULDER PAIN, RIGHT [M25.519] 10/15/2008  . KNEE PAIN, RIGHT [M25.569] 10/15/2008  . THUMB PAIN, LEFT [M79.609] 10/15/2008   Total Time spent with patient: 25 minutes    Past Medical History:  Past Medical History  Diagnosis Date  . ADD (attention deficit disorder)   . Hyperlipidemia   . Hypertension   . Pneumonia   . Anxiety   . Complication of anesthesia   . PONV (postoperative nausea and vomiting)   . Kidney stones     Past Surgical History  Procedure Laterality Date  . Orthopedic surgery      right clavicle  . Orif clavicular fracture Left 09/10/2014    Procedure: OPEN REDUCTION INTERNAL FIXATION (ORIF) CLAVICULAR FRACTURE;  Surgeon: Tania Ade, MD;  Location: [place];  Service: Orthopedics;  Laterality: Left;  Left Open reduction internal fixatin clavical  . Orif clavicular fracture Left 10/22/2014    Procedure: OPEN REDUCTION INTERNAL FIXATION (ORIF) CLAVICULAR FRACTURE, HARDWARE REMOVAL ;  Surgeon: Tania Ade, MD;  Location: [place];  Service: Orthopedics;  Laterality: Left;  Open reduction internal fixation left clavical fracture with left clavical hardware removal   Family History:  Family History  Problem Relation Age of Onset  . Stroke Mother   . Heart attack Maternal Grandfather   . Heart attack Paternal Grandmother   . Stroke Paternal Grandmother   . Fibromyalgia Father    Social History:  History  Alcohol Use  . Yes    Comment: daily sometimes up to 4 shots of rum a day  History  Drug Use No    Social History   Social History  . Marital Status: Married    Spouse Name: N/A  . Number of Children: N/A  . Years of Education: N/A   Social History Main Topics  . Smoking status: Never Smoker   . Smokeless tobacco: Never Used  . Alcohol Use: Yes     Comment: daily sometimes up to 4 shots of rum a day  . Drug Use: No  . Sexual Activity: Yes   Other Topics Concern  . None   Social History Narrative   Additional History:     Sleep:  Improved   Appetite:   Good    Assessment:   Musculoskeletal: Strength & Muscle Tone: within normal limits Gait & Station: normal Patient leans: N/A   Psychiatric Specialty Exam: Physical Exam  Review of Systems  All other systems reviewed and are negative. denies chest pain, denies shortness of breath, denies vomiting  Blood pressure 142/88, pulse 76, temperature 98.3 F (36.8 C), temperature source Oral, resp. rate 18, height 5' 11"  (1.803 m), weight 175 lb (79.379 kg), SpO2 100 %.Body mass index is 24.42 kg/(m^2).  General Appearance: Well Groomed  Engineer, water::  Good  Speech:  Clear and Coherent and Normal Rate  Volume:  Normal  Mood:   Describes improvement compared to admission  Affect:   More reactive, not irritable or agitated at this time  Thought Process:  Goal Directed, Intact and Linear  Orientation:  Full (Time, Place, and Person)  Thought Content:   Denies hallucinations, no delusions   Suicidal Thoughts:  No at this time denies any self injurious ideations or any thoughts of SI, denies any thoughts of hurting anyone else   Homicidal Thoughts:  No  Memory:  Negative  Judgement:  Intact  Insight:  Present  Psychomotor Activity:  Normal  Concentration:  Good  Recall:  Good  Fund of Knowledge:Good  Language: Good  Akathisia:  No  Handed:  Right  AIMS (if indicated):     Assets:  Communication Skills Desire for Improvement Financial Resources/Insurance Leisure Time Physical Health Resilience Social Support  ADL's:  Intact  Cognition: WNL  Sleep:  Number of Hours: 5.5     Current Medications: Current Facility-Administered Medications  Medication Dose Route Frequency Provider Last Rate Last Dose  . alum & mag hydroxide-simeth (MAALOX/MYLANTA) 200-200-20 MG/5ML suspension 30 mL  30 mL Oral Q4H PRN Harriet Butte, NP      . amphetamine-dextroamphetamine (ADDERALL XR) 24 hr capsule 20 mg  20 mg Oral Daily Harriet Butte, NP   20 mg at  12/15/14 0827  . fenofibrate tablet 160 mg  160 mg Oral Daily Harriet Butte, NP   160 mg at 12/15/14 6834  . hydrOXYzine (ATARAX/VISTARIL) tablet 25 mg  25 mg Oral TID PRN Harriet Butte, NP      . irbesartan (AVAPRO) tablet 37.5 mg  37.5 mg Oral Daily Harriet Butte, NP   37.5 mg at 12/15/14 0828  . lurasidone (LATUDA) tablet 40 mg  40 mg Oral Q breakfast Nanci Pina, FNP   40 mg at 12/15/14 1962  . magnesium hydroxide (MILK OF MAGNESIA) suspension 30 mL  30 mL Oral Daily PRN Harriet Butte, NP      . mirtazapine (REMERON) tablet 15 mg  15 mg Oral QHS Jenne Campus, MD   15 mg at 12/14/14 2209    Lab Results: No results found for this  or any previous visit (from the past 48 hour(s)).  Physical Findings: AIMS: Facial and Oral Movements Muscles of Facial Expression: None, normal Lips and Perioral Area: None, normal Jaw: None, normal Tongue: None, normal,Extremity Movements Upper (arms, wrists, hands, fingers): None, normal Lower (legs, knees, ankles, toes): None, normal, Trunk Movements Neck, shoulders, hips: None, normal, Overall Severity Severity of abnormal movements (highest score from questions above): None, normal Incapacitation due to abnormal movements: None, normal Patient's awareness of abnormal movements (rate only patient's report): No Awareness, Dental Status Current problems with teeth and/or dentures?: No Does patient usually wear dentures?: Yes  CIWA:  CIWA-Ar Total: 1 COWS:  COWS Total Score: 0   Assessment - patient reporting overall improvement in mood since admission, denying any suicidal or current self injurious ideations, and reporting he is tolerating current medication regimen well.  ( now on [place] and Remeron ) He is visible in milieu and interactive with peers . He reports history of self injurious behaviors such as self burning, but not recently and with a desire to abstain from these self injurious actions in the future .   Treatment Plan  Summary: Daily contact with patient to assess and evaluate symptoms and progress in treatment and Medication management   Plan. Continue inpatient treatment and continue to encourage group participation/milieu to address coping skills and help with symptom reduction Continue Latuda 40 mgrs QDAY for mood stabilization Continue Adderall XL  20 mgrs QDAY for history of ADHD  Continue Vistaril 25 mgrs Q 8 hours PRN for Anxiety Continue Remeron 15 mgrs QHS for Insomnia and Depression States requested labs were not done this AM, will reorder  CSW/ treatment team working on disposition options.   Medical Decision Making:  Established Problem, Stable/Improving (1), Review of Psycho-Social Stressors (1), Review or order clinical lab tests (1), Review of Medication Regimen & Side Effects (2) and Review of New Medication or Change in Dosage (2)   Neita Garnet , MD 12/15/2014, 4:23 PM
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
## 2014-12-15 NOTE — Progress Notes (Signed)
Per Pt, he would like a list of therapy options so he can schedule his own appointment. Declines referral assistance for therapy from CSW.  Chad Cordial, LCSWA Clinical Social Work [phone]

## 2014-12-15 NOTE — [facility] Group Notes (Signed)
[facility] LCSW Group Therapy 12/15/2014 1:15 PM  Type of Therapy: Group Therapy- Feelings about Diagnosis  Participation Level: Active   Participation Quality:  Appropriate  Affect:  Appropriate  Cognitive: Alert and Oriented   Insight:  Developing   Engagement in Therapy: Developing/Improving and Engaged   Modes of Intervention: Clarification, Confrontation, Discussion, Education, Exploration, Limit-setting, Orientation, Problem-solving, Rapport Building, Dance movement psychotherapist, Socialization and Support  Description of Group:   This group will allow patients to explore their thoughts and feelings about diagnoses they have received. Patients will be guided to explore their level of understanding and acceptance of these diagnoses. Facilitator will encourage patients to process their thoughts and feelings about the reactions of others to their diagnosis, and will guide patients in identifying ways to discuss their diagnosis with significant others in their lives. This group will be process-oriented, with patients participating in exploration of their own experiences as well as giving and receiving support and challenge from other group members.  Summary of Progress/Problems:  Pt participation is improving, more forthcoming in group discussion and direct with communication. He reported feeling that having a diagnosis in a relationship can be helpful as it provided a starting point and explanation for toxicity. He continued to describe it as a place to start regarding treatment and fixing problems in relationships. He encouraged other peers and was receptive to feedback.  Therapeutic Modalities:   Cognitive Behavioral Therapy Solution Focused Therapy Motivational Interviewing Relapse Prevention Therapy  Chad Cordial, LCSWA 12/15/2014 5:13 PM

## 2014-12-15 NOTE — Progress Notes (Signed)
[facility] Group Notes:  (Nursing/MHT/Case Management/Adjunct)  Date:  12/15/2014  Time:  10:22 AM  Type of Therapy:  Psychoeducational Skills  Participation Level:  Minimal  Participation Quality:  Appropriate  Affect:  Appropriate  Cognitive:  Appropriate  Insight:  Appropriate  Engagement in Group:  None  Modes of Intervention:  Socialization  Summary of Progress/Problems: Pt. In group but participated minimally.   Donell Beers 12/15/2014, 10:22 AM

## 2014-12-16 DIAGNOSIS — F332 Major depressive disorder, recurrent severe without psychotic features: Secondary | ICD-10-CM | POA: Diagnosis present

## 2014-12-16 LAB — LIPID PANEL
Cholesterol: 147 mg/dL (ref 0–200)
HDL: 49 mg/dL (ref >40)
LDL Cholesterol: 75 mg/dL (ref 0–99)
TRIGLYCERIDES: 114 mg/dL (ref <150)
Total CHOL/HDL Ratio: 3 RATIO
VLDL: 23 mg/dL (ref 0–40)

## 2014-12-16 LAB — TSH: TSH: 1.297 u[IU]/mL (ref 0.350–4.500)

## 2014-12-16 MED ORDER — LURASIDONE HCL 40 MG PO TABS: 40.0000 mg | ORAL_TABLET | Freq: Every day | ORAL | Status: DC

## 2014-12-16 MED ORDER — MIRTAZAPINE 15 MG PO TABS: 15.0000 mg | ORAL_TABLET | Freq: Every day | ORAL | Status: DC

## 2014-12-16 MED ORDER — HYDROXYZINE HCL 25 MG PO TABS: 25.0000 mg | ORAL_TABLET | Freq: Three times a day (TID) | ORAL | Status: DC | PRN

## 2014-12-16 MED ORDER — AMPHETAMINE-DEXTROAMPHET ER 20 MG PO CP24: 20.0000 mg | ORAL_CAPSULE | Freq: Every day | ORAL | Status: DC

## 2014-12-16 MED ORDER — FENOFIBRATE 160 MG PO TABS: ORAL_TABLET | ORAL | Status: DC

## 2014-12-16 MED ORDER — IRBESARTAN 75 MG PO TABS: 37.5000 mg | ORAL_TABLET | Freq: Every day | ORAL | Status: DC

## 2014-12-16 NOTE — Tx Team (Signed)
Interdisciplinary Treatment Plan Update (Adult) Date: 12/15/2014   Date: 12/15/2014 1:24 PM  Progress in Treatment:  Attending groups: Yes  Participating in groups: Yes  Taking medication as prescribed: Yes  Tolerating medication: Yes  Family/Significant othe contact made: Yes, with friend Patient understands diagnosis: Yes Discussing patient identified problems/goals with staff: Yes  Medical problems stabilized or resolved: Yes  Denies suicidal/homicidal ideation: Yes Patient has not harmed self or Others: Yes   New problem(s) identified: None identified at this time.   Discharge Plan or Barriers: Pt will discharge to his home and follow-up with PCP  Additional comments: n/a   Reason for Continuation of Hospitalization:  Depression Medication stabilization Suicidal ideation  Estimated length of stay: 0 days; Pt stable for DC today.  Review of initial/current patient goals per problem list:   1.  Goal(s): Patient will participate in aftercare plan  Met:  Yes  Target date: 3-5 days from date of admission   As evidenced by: Patient will participate within aftercare plan AEB aftercare provider and housing plan at discharge being identified.   12/15/14: Pt discharging home and will follow-up with PCP  2.  Goal (s): Patient will exhibit decreased depressive symptoms and suicidal ideations.  Met:  Yes  Target date: 3-5 days from date of admission   As evidenced by: Patient will utilize self rating of depression at 3 or below and demonstrate decreased signs of depression or be deemed stable for discharge by MD.  12/15/14: Pt rates depression at 0/10 and denies SI. Much improved affect and mood, involved in groups and motivated for continued treatment.  Attendees:  Patient:    Family:    Physician: Dr. Parke Poisson, MD  12/15/14 1:24 PM   Nursing: Lars Pinks, RN Case manager  12/15/14 1:24 PM     Clinical Social Worker Norman Clay, MSW 12/15/14 1:24 PM     Other:  Lucinda Dell, Beverly Sessions Liasion 12/15/14 1:24 PM     Clinical: Gaylan Gerold, RN 12/15/14 1:24 PM     Other: , RN Charge Nurse   Other:     Peri Maris, Latanya Presser MSW

## 2014-12-16 NOTE — Progress Notes (Signed)
Patient ID: Keith Johnson, male   DOB: [birth year], 39 y.o.   MRN: [id number]  Pt. Denies SI/HI and A/V hallucinations. Belongings returned to patient at time of discharge. Patient denies any pain or discomfort. Discharge instructions and medications were reviewed with patient. Patient verbalized understanding of both medications and discharge instructions. Patient was discharged to lobby. Q15 minute safety checks maintained until discharge. Patient discharged with no apparent distress.

## 2014-12-16 NOTE — Progress Notes (Signed)
  [facility] Adult Case Management Discharge Plan :  Will you be returning to the same living situation after discharge:  Yes,  Pt returning to his home At discharge, do you have transportation home?: Yes,  Pt [facility] own car at [facility] Do you have the ability to pay for your medications: Yes,  Pt provided with prescriptions  Release of information consent forms completed and in the chart;  Patient's signature needed at discharge.  Patient to Follow up at: Follow-up Information    [facility]   Why:  at 11:15am for medication management with Dr. Gean Maidens information:   [address]  [place] Ph [phone]  Fax [phone]      Patient denies SI/HI: Yes,  Pt denies    Safety Planning and Suicide Prevention discussed: Yes,  with friend; see SPE note for further details  Have you used any form of tobacco in the last 30 days? (Cigarettes, Smokeless Tobacco, Cigars, and/or Pipes): No  Has patient been referred to the Quitline?: N/A patient is not a smoker  Elaina Hoops 12/16/2014, 11:29 AM

## 2014-12-16 NOTE — [facility] Suicide Risk Assessment (Signed)
[facility] INPATIENT:  Family/Significant Other Suicide Prevention Education  Suicide Prevention Education:  Education Completed; Sheppard Coil, Pt's friend [phone],  has been identified by the patient as the family member/significant other with whom the patient will be residing, and identified as the person(s) who will aid the patient in the event of a mental health crisis (suicidal ideations/suicide attempt).  With written consent from the patient, the family member/significant other has been provided the following suicide prevention education, prior to the and/or following the discharge of the patient.  The suicide prevention education provided includes the following:  Suicide risk factors  Suicide prevention and interventions  National Suicide Hotline telephone number  [facility] assessment telephone number  [facility] Emergency Assistance 911  [place] and/or Residential Mobile Crisis Unit telephone number  Request made of family/significant other to:  Remove weapons (e.g., guns, rifles, knives), all items previously/currently identified as safety concern.    Remove drugs/medications (over-the-counter, prescriptions, illicit drugs), all items previously/currently identified as a safety concern.  The family member/significant other verbalizes understanding of the suicide prevention education information provided.  The family member/significant other agrees to remove the items of safety concern listed above.  Elaina Hoops 12/16/2014, 11:31 AM

## 2014-12-16 NOTE — [facility] Suicide Risk Assessment (Addendum)
[facility] Discharge Suicide Risk Assessment   Demographic Factors:  38 year old male, currently separated, has three children, employed   Total Time spent with patient: 30 minutes  Musculoskeletal: Strength & Muscle Tone: within normal limits Gait & Station: normal Patient leans: N/A  Psychiatric Specialty Exam: Physical Exam  ROS  Blood pressure 148/86, pulse 63, temperature 97.8 F (36.6 C), temperature source Oral, resp. rate 18, height  (1.803 m), weight 175 lb (79.379 kg), SpO2 100 %.Body mass index is 24.42 kg/(m^2).  General Appearance: Well Groomed  Patent attorney::  Good  Speech:  Normal Rate409  Volume:  Normal  Mood:  Euthymic- at this time denies depression  Affect:  Full Range- full range of affect   Thought Process:  Linear  Orientation:  Full (Time, Place, and Person)  Thought Content:  no hallucinations, no delusions  Suicidal Thoughts:  No- denies any SI or self injurious ideations  Homicidal Thoughts:  No- denies any thoughts of violence towards anyone   Memory:  recent and remote grossly intact   Judgement:  Other:  improved   Insight:  Present  Psychomotor Activity:  Normal  Concentration:  Good  Recall:  Good  Fund of Knowledge:Good  Language: Good  Akathisia:  NA  Handed:  Right  AIMS (if indicated):     Assets:  Communication Skills Desire for Improvement Physical Health Resilience  Sleep:  Number of Hours: 6.25  Cognition: WNL  ADL's:  Intact   Have you used any form of tobacco in the last 30 days? (Cigarettes, Smokeless Tobacco, Cigars, and/or Pipes): No  Has this patient used any form of tobacco in the last 30 days? (Cigarettes, Smokeless Tobacco, Cigars, and/or Pipes) No  Mental Status Per Nursing Assessment::   On Admission:  NA  Current Mental Status by Physician: At this time patient is improved compared to his admission presentation- his mood is much improved and at present not depressed, his affect is reactive, no thought disorder,  not suicidal or homicidal , not psychotic, and future oriented, plans to return to work as of next week , looking forward to reunite with his loved ones .   Loss Factors: Recent  Marital separation, work / family stressors .   Historical Factors: No prior history of psychiatric admissions, no history of prior suicide attempts, history of self injurious behaviors in the past .  Risk Reduction Factors:   Responsible for children under 84 years of age, Sense of responsibility to family, Employed and Positive coping skills or problem solving skills  Continued Clinical Symptoms:   As noted patient currently improved, presents euthymic, full range of affect, no SI or HI, behavior on unit calm, cooperative .   Cognitive Features That Contribute To Risk:  No gross cognitive deficits noted upon discharge. Is alert , attentive, and oriented x 3   Suicide Risk:  Mild:  Suicidal ideation of limited frequency, intensity, duration, and specificity.  There are no identifiable plans, no associated intent, mild dysphoria and related symptoms, good self-control (both objective and subjective assessment), few other risk factors, and identifiable protective factors, including available and accessible social support.  Principal Problem: <principal problem not specified> Discharge Diagnoses:  Patient Active Problem List   Diagnosis Date Noted  . 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 Suicide attempt [T14.91] 12/11/2014  . Physical exam [Z00.00] 08/19/2014  . Anxiety state [F41.1] 08/19/2014  . Allergic rhinitis [J30.9] 09/10/2013  . Sinusitis, acute frontal [J01.10] 08/01/2013  .  Cerumen impaction [H61.20] 08/01/2013  . HTN (hypertension) [I10] 06/02/2013  . Hypertriglyceridemia [E78.1] 06/02/2013  . Rib pain on right side [R07.81] 10/09/2012  . ADD (attention deficit disorder) [F90.9] 08/05/2012  . SHOULDER PAIN, RIGHT [M25.519] 10/15/2008  . KNEE PAIN, RIGHT  [M25.569] 10/15/2008  . THUMB PAIN, LEFT [M79.609] 10/15/2008    Follow-up Information    Follow up with Dr Joycelyn Das .   Why:  Left voicemail in attempts to schedule appointment with Dr. Beverely Low.   Contact information:   [address]  [place] Ph [phone]  Fax [phone]      Plan Of Care/Follow-up recommendations:  Activity:  as tolerated  Diet:  heart healthy Tests:  NA Other:  see below   Is patient on multiple antipsychotic therapies at discharge:  No   Has Patient had three or more failed trials of antipsychotic monotherapy by history:  No  Recommended Plan for Multiple Antipsychotic Therapies: NA  Patient is leaving in good spirits. Plans to return home . Follow up as above .  I have encouraged patient to consider outpatient individual psychotherapy- states he will consider it if he feels symptoms continue or persist.   COBOS, FERNANDO 12/16/2014, 10:56 AM

## 2014-12-16 NOTE — [facility] Group Notes (Signed)
Pt attended group. Pt stated he had a great day until he had a visit with his wife this evening. Pt requested toothbrush and bar soap from this Clinical research associate. This Clinical research associate provided the pt with the items he needed and he did not require anything else. Deatra James, MHT/NS

## 2014-12-16 NOTE — Discharge Summary (Signed)
Physician Discharge Summary Note  Patient:  Keith Johnson is an 39 y.o., male MRN:  [id number] DOB:  09-04-1975 Patient phone:  [phone] (home)  Patient address:   [address] [place] [address],  Total Time spent with patient: 45 minutes  Date of Admission:  12/10/2014 Date of Discharge: 12/16/14  Reason for Admission:   History of Present Illness:: Keith Johnson is a 39 y.o white male presents to the hospital with Suicide attempt by cuts to both wrists. Patient reports he contacted his exstraged wife after he cut his wrists. He states 4 months ago started having problem with anxiety and agitation was prescribed Prozac by his family doctor. He states the medication was working well but recently he starting getting depressed, feeling overwhelmed and stressed by work and his marriage/relationship. He reports his first marriage ended in divorce. He has 3 boys with first wife, the 74 year old and 57 year old who live with him and the 37 year old lives with his mom. He reports his 2nd marriage is failing and that is depressing. He reports he is currently separated from his second wife and reports that he desired the separation. He reports upsetting his exstraged wife got him depressed because "it is hard to rip myself away from people". He reports currently on Adderall 20 mg daily for ADHD. He report diagnosed with ADHD at 17 years. He reports he was prescribed Ritalin which he used till age 69 year. Patient is also hypertensive and on medication. He denies ever receiving outpatient counseling or inpatient treatment. According to Patient "I do not believe in therapy".   Principal Problem: MDD (major depressive disorder), recurrent severe, without psychosis Discharge Diagnoses: Patient Active Problem List   Diagnosis Date Noted  . MDD (major depressive disorder), recurrent severe, without psychosis [F33.2] 12/16/2014    Priority: High  . Suicide attempt [T14.91]  12/11/2014    Priority: High  . Physical exam [Z00.00] 08/19/2014  . Anxiety state [F41.1] 08/19/2014  . Allergic rhinitis [J30.9] 09/10/2013  . Sinusitis, acute frontal [J01.10] 08/01/2013  . Cerumen impaction [H61.20] 08/01/2013  . HTN (hypertension) [I10] 06/02/2013  . Hypertriglyceridemia [E78.1] 06/02/2013  . Rib pain on right side [R07.81] 10/09/2012  . ADD (attention deficit disorder) [F90.9] 08/05/2012  . SHOULDER PAIN, RIGHT [M25.519] 10/15/2008  . KNEE PAIN, RIGHT [M25.569] 10/15/2008  . THUMB PAIN, LEFT [M79.609] 10/15/2008    Musculoskeletal: Strength & Muscle Tone: within normal limits Gait & Station: normal Patient leans: N/A  Psychiatric Specialty Exam: Physical Exam  Review of Systems  Psychiatric/Behavioral: Positive for depression. Negative for suicidal ideas. The patient is nervous/anxious and has insomnia.   All other systems reviewed and are negative.   Blood pressure 148/86, pulse 63, temperature 97.8 F (36.6 C), temperature source Oral, resp. rate 18, height 5\' 11"  (1.803 m), weight 79.379 kg (175 lb), SpO2 100 %.Body mass index is 24.42 kg/(m^2).  SEE MD PSE within the SRA   Have you used any form of tobacco in the last 30 days? (Cigarettes, Smokeless Tobacco, Cigars, and/or Pipes): No  Has this patient used any form of tobacco in the last 30 days? (Cigarettes, Smokeless Tobacco, Cigars, and/or Pipes) No  Past Medical History:  Past Medical History  Diagnosis Date  . ADD (attention deficit disorder)   . Hyperlipidemia   . Hypertension   . Pneumonia   . Anxiety   . Complication of anesthesia   . PONV (postoperative nausea and vomiting)   . Kidney stones  Past Surgical History  Procedure Laterality Date  . Orthopedic surgery      right clavicle  . Orif clavicular fracture Left 09/10/2014    Procedure: OPEN REDUCTION INTERNAL FIXATION (ORIF) CLAVICULAR FRACTURE;  Surgeon: Jones Broom, MD;  Location: [facility] OR;  Service: Orthopedics;   Laterality: Left;  Left Open reduction internal fixatin clavical  . Orif clavicular fracture Left 10/22/2014    Procedure: OPEN REDUCTION INTERNAL FIXATION (ORIF) CLAVICULAR FRACTURE, HARDWARE REMOVAL ;  Surgeon: Jones Broom, MD;  Location: [facility];  Service: Orthopedics;  Laterality: Left;  Open reduction internal fixation left clavical fracture with left clavical hardware removal   Family History:  Family History  Problem Relation Age of Onset  . Stroke Mother   . Heart attack Maternal Grandfather   . Heart attack Paternal Grandmother   . Stroke Paternal Grandmother   . Fibromyalgia Father    Social History:  History  Alcohol Use  . Yes    Comment: daily sometimes up to 4 shots of rum a day     History  Drug Use No    Social History   Social History  . Marital Status: Married    Spouse Name: N/A  . Number of Children: N/A  . Years of Education: N/A   Social History Main Topics  . Smoking status: Never Smoker   . Smokeless tobacco: Never Used  . Alcohol Use: Yes     Comment: daily sometimes up to 4 shots of rum a day  . Drug Use: No  . Sexual Activity: Yes   Other Topics Concern  . None   Social History Narrative     Risk to Self: Is patient at risk for suicide?: Yes What has been your use of drugs/alcohol within the last 12 months?: Pt reports he occassionally uses alcohol to gwet to sleep but does not like the feeling of intoxication so limits cobnsumption Risk to Others:   Prior Inpatient Therapy:   Prior Outpatient Therapy:    Level of Care:  OP  Hospital Course:   RASEAN JOOS was admitted for MDD (major depressive disorder), recurrent severe, without psychosis, and crisis management.  Pt was treated discharged with the medications listed below under Medication List.  Medical problems were identified and treated as needed.  Home medications were restarted as appropriate.  Improvement was monitored by observation and Cheron Every 's daily report of symptom reduction.  Emotional and mental status was monitored by daily self-inventory reports completed by Cheron Every and clinical staff.         Cheron Every was evaluated by the treatment team for stability and plans for continued recovery upon discharge. Cheron Every 's motivation was an integral factor for scheduling further treatment. Employment, transportation, bed availability, health status, family support, and any pending legal issues were also considered during hospital stay. Pt was offered further treatment options upon discharge including but not limited to Residential, Intensive Outpatient, and Outpatient treatment.  Cheron Every will follow up with the services as listed below under Follow Up Information.     Upon completion of this admission the patient was both mentally and medically stable for discharge denying suicidal/homicidal ideation, auditory/visual/tactile hallucinations, delusional thoughts and paranoia.     Consults:  None  Significant Diagnostic Studies:  UDS + for amphetamines  Discharge Vitals:   Blood pressure 148/86, pulse 63, temperature 97.8 F (36.6 C), temperature source Oral, resp. rate 18, height  (1.803  m), weight 79.379 kg (175 lb), SpO2 100 %. Body mass index is 24.42 kg/(m^2). Lab Results:   Results for orders placed or performed during the hospital encounter of 12/10/14 (from the past 72 hour(s))  TSH     Status: None   Collection Time: 12/16/14  6:45 AM  Result Value Ref Range   TSH 1.297 0.350 - 4.500 uIU/mL    Comment: Performed at [facility]  Lipid panel     Status: None   Collection Time: 12/16/14  6:45 AM  Result Value Ref Range   Cholesterol 147 0 - 200 mg/dL   Triglycerides 098 <119 mg/dL   HDL 49 >14 mg/dL   Total CHOL/HDL Ratio 3.0 RATIO   VLDL 23 0 - 40 mg/dL   LDL Cholesterol 75 0 - 99 mg/dL    Comment:        Total Cholesterol/HDL:CHD  Risk Coronary Heart Disease Risk Table                     Men   Women  1/2 Average Risk   3.4   3.3  Average Risk       5.0   4.4  2 X Average Risk   9.6   7.1  3 X Average Risk  23.4   11.0        Use the calculated Patient Ratio above and the CHD Risk Table to determine the patient's CHD Risk.        ATP III CLASSIFICATION (LDL):  <100     mg/dL   Optimal  782-956  mg/dL   Near or Above                    Optimal  130-159  mg/dL   Borderline  213-086  mg/dL   High  >578     mg/dL   Very High Performed at [facility]     Physical Findings: AIMS: Facial and Oral Movements Muscles of Facial Expression: None, normal Lips and Perioral Area: None, normal Jaw: None, normal Tongue: None, normal,Extremity Movements Upper (arms, wrists, hands, fingers): None, normal Lower (legs, knees, ankles, toes): None, normal, Trunk Movements Neck, shoulders, hips: None, normal, Overall Severity Severity of abnormal movements (highest score from questions above): None, normal Incapacitation due to abnormal movements: None, normal Patient's awareness of abnormal movements (rate only patient's report): No Awareness, Dental Status Current problems with teeth and/or dentures?: No Does patient usually wear dentures?: Yes  CIWA:  CIWA-Ar Total: 1 COWS:  COWS Total Score: 0   See Psychiatric Specialty Exam and Suicide Risk Assessment completed by Attending Physician prior to discharge.  Discharge destination:  Home  Is patient on multiple antipsychotic therapies at discharge:  No   Has Patient had three or more failed trials of antipsychotic monotherapy by history:  No    Recommended Plan for Multiple Antipsychotic Therapies: NA     Medication List    STOP taking these medications        FLUoxetine 40 MG capsule  Commonly known as:  PROZAC     HYDROcodone-acetaminophen 5-325 MG tablet  Commonly known as:  NORCO     valsartan 80 MG tablet  Commonly known as:  DIOVAN   Replaced by:  irbesartan 75 MG tablet      TAKE these medications      Indication   amphetamine-dextroamphetamine 20 MG 24 hr capsule  Commonly known as:  ADDERALL XR  Take  1 capsule (20 mg total) by mouth daily.   Indication:  Attention Deficit Hyperactivity Disorder     fenofibrate 160 MG tablet  TAKE 1 TABLET BY MOUTH ONCE DAILY   Indication:  Elevation of Both Cholesterol and Triglycerides in Blood     hydrOXYzine 25 MG tablet  Commonly known as:  ATARAX/VISTARIL  Take 1 tablet (25 mg total) by mouth 3 (three) times daily as needed for anxiety.   Indication:  Anxiety Neurosis     irbesartan 75 MG tablet  Commonly known as:  AVAPRO  Take 0.5 tablets (37.5 mg total) by mouth daily.   Indication:  High Blood Pressure     lurasidone 40 MG Tabs tablet  Commonly known as:  LATUDA  Take 1 tablet (40 mg total) by mouth daily with breakfast.   Indication:  mood stabilization     mirtazapine 15 MG tablet  Commonly known as:  REMERON  Take 1 tablet (15 mg total) by mouth at bedtime.   Indication:  Trouble Sleeping, Major Depressive Disorder           Follow-up Information    Follow up with Dr Joycelyn Das .   Why:  Left voicemail in attempts to schedule appointment with Dr. Beverely Low.   Contact information:   [address]  [place] Ph [phone]  Fax [phone]      Follow-up recommendations:  Activity:  As tolerated Diet:  Heart healthy with low sodium.  Comments:   Take all medications as prescribed. Keep all follow-up appointments as scheduled.  Do not consume alcohol or use illegal drugs while on prescription medications. Report any adverse effects from your medications to your primary care provider promptly.  In the event of recurrent symptoms or worsening symptoms, call 911, a crisis hotline, or go to the nearest emergency department for evaluation.   Total Discharge Time: Greater than 30 minutes  Signed: Beau Fanny,  FNP-BC 12/16/2014, 11:12 AM  Patient seen, Suicide Assessment Completed.  Disposition Plan Reviewed

## 2014-12-17 LAB — HEMOGLOBIN A1C
Hgb A1c MFr Bld: 5.3 % (ref 4.8–5.6)
Mean Plasma Glucose: 105 mg/dL

## 2014-12-18 ENCOUNTER — Telehealth: Payer: Self-pay | Admitting: *Deleted

## 2014-12-18 NOTE — Telephone Encounter (Signed)
Transition Care Management Follow-up Telephone Call   Date discharged? 12/16/14   How have you been since you were released from the hospital? "good"  Patient denies depression or suicidal ideation   Do you understand why you were in the hospital? Yes- suicide attempt    Do you understand the discharge instructions? yes   Where were you discharged to? Home    Items Reviewed:  Medications reviewed: yes- discontinued Norco, Prozac, and Valsartan   Allergies reviewed: yes  Dietary changes reviewed: yes- low sodium, heart healthy   Referrals reviewed: yes- patient states he has counseling if needed    Functional Questionnaire:   Activities of Daily Living (ADLs):   He states they are independent in the following: ambulation, bathing and hygiene, feeding, continence, grooming, toileting and dressing States they require assistance with the following: none, patient independent   Any transportation issues/concerns?: no   Any patient concerns? no   Confirmed importance and date/time of follow-up visits scheduled yes- scheduled 12/24/14 at 11:00  Provider Appointment booked with Dr. Beverely Low   Confirmed with patient if condition begins to worsen call PCP or go to the ER.  Patient was given the office number and encouraged to call back with question or concerns.  : yes

## 2014-12-24 ENCOUNTER — Encounter: Payer: Self-pay | Admitting: Family Medicine

## 2014-12-24 ENCOUNTER — Ambulatory Visit (INDEPENDENT_AMBULATORY_CARE_PROVIDER_SITE_OTHER): Payer: 59 | Admitting: Family Medicine

## 2014-12-24 VITALS — BP 130/74 | HR 76 | Temp 98.1°F | Resp 16 | Ht 71.0 in | Wt 181.1 lb

## 2014-12-24 DIAGNOSIS — F332 Major depressive disorder, recurrent severe without psychotic features: Secondary | ICD-10-CM | POA: Diagnosis not present

## 2014-12-24 DIAGNOSIS — I10 Essential (primary) hypertension: Secondary | ICD-10-CM | POA: Diagnosis not present

## 2014-12-24 MED ORDER — AMPHETAMINE-DEXTROAMPHET ER 25 MG PO CP24: 25.0000 mg | ORAL_CAPSULE | ORAL | Status: DC

## 2014-12-24 NOTE — Assessment & Plan Note (Signed)
Chronic problem.  BP meds were changed during recent hospitalization.  BP well controlled.  Currently asymptomatic.  Will continue to follow.

## 2014-12-24 NOTE — Progress Notes (Signed)
   Subjective:    Patient ID: Keith Johnson, male    DOB: 15-Aug-1975, 39 y.o.   MRN: [id number]  HPI Hospital f/u- 'i have bipolar depression.  i just don't tell anybody'.  Was admitted 9/22-28 after suicide attempt (slit wrists).  During hospitalization Prozac was stopped and he was started on Remeron and Jordan.  Pt reports sxs are now well controlled on new meds.  No SI/HI.  Does notice that adderall is less effective in combination w/ new medications.  Pt is refusing therapy.  Pt is divorcing wife, 'i can't give her what she wants.  She wants kids.  i can't give her kids'.  2 of 3 kids from previous marriage are with him.  HTN- BP meds were switched to Avapro 1/2 tab.  Well controlled.  No CP, SOB, HAs, visual changes, edema.   Review of Systems For ROS see HPI     Objective:   Physical Exam  Constitutional: He is oriented to person, place, and time. He appears well-developed and well-nourished. No distress.  HENT:  Head: Normocephalic and atraumatic.  Eyes: Conjunctivae and EOM are normal. Pupils are equal, round, and reactive to light.  Neck: Normal range of motion. Neck supple. No thyromegaly present.  Cardiovascular: Normal rate, regular rhythm, normal heart sounds and intact distal pulses.   No murmur heard. Pulmonary/Chest: Effort normal and breath sounds normal. No respiratory distress.  Abdominal: Soft. Bowel sounds are normal. He exhibits no distension.  Musculoskeletal: He exhibits no edema.  Lymphadenopathy:    He has no cervical adenopathy.  Neurological: He is alert and oriented to person, place, and time. No cranial nerve deficit.  Skin: Skin is warm and dry.  Psychiatric: He has a normal mood and affect. His behavior is normal.  Vitals reviewed.         Assessment & Plan:

## 2014-12-24 NOTE — Progress Notes (Signed)
Pre visit review using our clinic review tool, if applicable. No additional management support is needed unless otherwise documented below in the visit note. 

## 2014-12-24 NOTE — Patient Instructions (Signed)
Follow up in 1 month to recheck mood Increase the Adderall to  daily Continue the Latuda and Remeron as directed If you feel yourself backsliding, please call immediately! Call with any questions or concerns Hang in there!!!

## 2014-12-24 NOTE — Assessment & Plan Note (Signed)
Pt admits that he is bipolar and has been since he was a teen.  Now on Latuda and Remeron.  Pt feels much better than previously.  Denies SI/HI.  Pt notes that his Adderall is no longer effective since medication was changed.  Increase to  daily and monitor for improvement.  Pt refusing counseling.  Will continue to follow closely.

## 2015-01-07 ENCOUNTER — Emergency Department (HOSPITAL_COMMUNITY): Payer: 59

## 2015-01-07 ENCOUNTER — Emergency Department (HOSPITAL_COMMUNITY)
Admission: EM | Admit: 2015-01-07 | Discharge: 2015-01-08 | Disposition: A | Discharge status: Home / Self Care | Payer: 59 | Attending: Emergency Medicine | Admitting: Emergency Medicine

## 2015-01-07 ENCOUNTER — Encounter (HOSPITAL_COMMUNITY): Payer: Self-pay | Admitting: Emergency Medicine

## 2015-01-07 DIAGNOSIS — I10 Essential (primary) hypertension: Secondary | ICD-10-CM | POA: Insufficient documentation

## 2015-01-07 DIAGNOSIS — Z8701 Personal history of pneumonia (recurrent): Secondary | ICD-10-CM | POA: Diagnosis not present

## 2015-01-07 DIAGNOSIS — F419 Anxiety disorder, unspecified: Secondary | ICD-10-CM | POA: Diagnosis not present

## 2015-01-07 DIAGNOSIS — R109 Unspecified abdominal pain: Secondary | ICD-10-CM | POA: Diagnosis not present

## 2015-01-07 DIAGNOSIS — M545 Low back pain: Secondary | ICD-10-CM | POA: Diagnosis not present

## 2015-01-07 DIAGNOSIS — Z79899 Other long term (current) drug therapy: Secondary | ICD-10-CM | POA: Diagnosis not present

## 2015-01-07 DIAGNOSIS — R079 Chest pain, unspecified: Secondary | ICD-10-CM | POA: Insufficient documentation

## 2015-01-07 DIAGNOSIS — F902 Attention-deficit hyperactivity disorder, combined type: Secondary | ICD-10-CM | POA: Diagnosis not present

## 2015-01-07 DIAGNOSIS — E785 Hyperlipidemia, unspecified: Secondary | ICD-10-CM | POA: Insufficient documentation

## 2015-01-07 DIAGNOSIS — Z87442 Personal history of urinary calculi: Secondary | ICD-10-CM | POA: Diagnosis not present

## 2015-01-07 LAB — URINE MICROSCOPIC-ADD ON

## 2015-01-07 LAB — URINALYSIS, ROUTINE W REFLEX MICROSCOPIC
BILIRUBIN URINE: NEGATIVE
Glucose, UA: NEGATIVE mg/dL
Ketones, ur: NEGATIVE mg/dL
Leukocytes, UA: NEGATIVE
NITRITE: NEGATIVE
PH: 5.5 (ref 5.0–8.0)
Protein, ur: NEGATIVE mg/dL
SPECIFIC GRAVITY, URINE: 1.027 (ref 1.005–1.030)
Urobilinogen, UA: 0.2 mg/dL (ref 0.0–1.0)

## 2015-01-07 LAB — CBC
HCT: 38.9 % — ABNORMAL LOW (ref 39.0–52.0)
HEMOGLOBIN: 12.7 g/dL — AB (ref 13.0–17.0)
MCH: 30.8 pg (ref 26.0–34.0)
MCHC: 32.6 g/dL (ref 30.0–36.0)
MCV: 94.4 fL (ref 78.0–100.0)
PLATELETS: 274 10*3/uL (ref 150–400)
RBC: 4.12 MIL/uL — AB (ref 4.22–5.81)
RDW: 13.5 % (ref 11.5–15.5)
WBC: 6.7 10*3/uL (ref 4.0–10.5)

## 2015-01-07 LAB — BASIC METABOLIC PANEL
ANION GAP: 6 (ref 5–15)
BUN: 14 mg/dL (ref 6–20)
CHLORIDE: 107 mmol/L (ref 101–111)
CO2: 25 mmol/L (ref 22–32)
CREATININE: 1.09 mg/dL (ref 0.61–1.24)
Calcium: 8.9 mg/dL (ref 8.9–10.3)
GFR calc non Af Amer: >60 mL/min (ref >60)
Glucose, Bld: 102 mg/dL — ABNORMAL HIGH (ref 65–99)
POTASSIUM: 3.5 mmol/L (ref 3.5–5.1)
SODIUM: 138 mmol/L (ref 135–145)

## 2015-01-07 LAB — I-STAT TROPONIN, ED
TROPONIN I, POC: 0.01 ng/mL (ref 0.00–0.08)
TROPONIN I, POC: 0 ng/mL (ref 0.00–0.08)

## 2015-01-07 LAB — TROPONIN I: Troponin I: <0.03 ng/mL (ref <0.031)

## 2015-01-07 MED ORDER — ASPIRIN 81 MG PO CHEW
324.0000 mg | CHEWABLE_TABLET | Freq: Once | ORAL | Status: AC
  Administered 2015-01-07: 324 mg via ORAL
  Filled 2015-01-07: qty 4

## 2015-01-07 NOTE — ED Notes (Signed)
Bed: RESA Expected date:  Expected time:  Means of arrival:  Comments: TR 2 

## 2015-01-07 NOTE — ED Notes (Signed)
Patient c/o sharp left central chest pain and left lower side/back onset today at 1800.

## 2015-01-07 NOTE — ED Notes (Signed)
Code stemi called and cancelled but stemi doctor called for dr Cyndie ChimeNguyen

## 2015-01-08 NOTE — Discharge Instructions (Signed)
Nonspecific Chest Pain   The cause of your sudden chest pain and back pain tonight is uncertain at this time.  There are lots of things that can cause pain but it does not appear that anything life threatening is causing these symptoms at this time.  Follow up outpatient with your doctor and with cardiology as soon as possible.  Rest and do not partake in strenuous activity until you follow up.  Chest pain can be caused by many different conditions. There is always a chance that your pain could be related to something serious, such as a heart attack or a blood clot in your lungs. Chest pain can also be caused by conditions that are not life-threatening. If you have chest pain, it is very important to follow up with your health care provider. CAUSES  Chest pain can be caused by:  Heartburn.  Pneumonia or bronchitis.  Anxiety or stress.  Inflammation around your heart (pericarditis) or lung (pleuritis or pleurisy).  A blood clot in your lung.  A collapsed lung (pneumothorax). It can develop suddenly on its own (spontaneous pneumothorax) or from trauma to the chest.  Shingles infection (varicella-zoster virus).  Heart attack.  Damage to the bones, muscles, and cartilage that make up your chest wall. This can include:  Bruised bones due to injury.  Strained muscles or cartilage due to frequent or repeated coughing or overwork.  Fracture to one or more ribs.  Sore cartilage due to inflammation (costochondritis). RISK FACTORS  Risk factors for chest pain may include:  Activities that increase your risk for trauma or injury to your chest.  Respiratory infections or conditions that cause frequent coughing.  Medical conditions or overeating that can cause heartburn.  Heart disease or family history of heart disease.  Conditions or health behaviors that increase your risk of developing a blood clot.  Having had chicken pox (varicella zoster). SIGNS AND SYMPTOMS Chest pain can  feel like:  Burning or tingling on the surface of your chest or deep in your chest.  Crushing, pressure, aching, or squeezing pain.  Dull or sharp pain that is worse when you move, cough, or take a deep breath.  Pain that is also felt in your back, neck, shoulder, or arm, or pain that spreads to any of these areas. Your chest pain may come and go, or it may stay constant. DIAGNOSIS Lab tests or other studies may be needed to find the cause of your pain. Your health care provider may have you take a test called an ambulatory ECG (electrocardiogram). An ECG records your heartbeat patterns at the time the test is performed. You may also have other tests, such as:  Transthoracic echocardiogram (TTE). During echocardiography, sound waves are used to create a picture of all of the heart structures and to look at how blood flows through your heart.  Transesophageal echocardiogram (TEE).This is a more advanced imaging test that obtains images from inside your body. It allows your health care provider to see your heart in finer detail.  Cardiac monitoring. This allows your health care provider to monitor your heart rate and rhythm in real time.  Holter monitor. This is a portable device that records your heartbeat and can help to diagnose abnormal heartbeats. It allows your health care provider to track your heart activity for several days, if needed.  Stress tests. These can be done through exercise or by taking medicine that makes your heart beat more quickly.  Blood tests.  Imaging tests. TREATMENT  Your treatment depends on what is causing your chest pain. Treatment may include:  Medicines. These may include:  Acid blockers for heartburn.  Anti-inflammatory medicine.  Pain medicine for inflammatory conditions.  Antibiotic medicine, if an infection is present.  Medicines to dissolve blood clots.  Medicines to treat coronary artery disease.  Supportive care for conditions that do  not require medicines. This may include:  Resting.  Applying heat or cold packs to injured areas.  Limiting activities until pain decreases. HOME CARE INSTRUCTIONS  If you were prescribed an antibiotic medicine, finish it all even if you start to feel better.  Avoid any activities that bring on chest pain.  Do not use any tobacco products, including cigarettes, chewing tobacco, or electronic cigarettes. If you need help quitting, ask your health care provider.  Do not drink alcohol.  Take medicines only as directed by your health care provider.  Keep all follow-up visits as directed by your health care provider. This is important. This includes any further testing if your chest pain does not go away.  If heartburn is the cause for your chest pain, you may be told to keep your head raised (elevated) while sleeping. This reduces the chance that acid will go from your stomach into your esophagus.  Make lifestyle changes as directed by your health care provider. These may include:  Getting regular exercise. Ask your health care provider to suggest some activities that are safe for you.  Eating a heart-healthy diet. A registered dietitian can help you to learn healthy eating options.  Maintaining a healthy weight.  Managing diabetes, if necessary.  Reducing stress. SEEK MEDICAL CARE IF:  Your chest pain does not go away after treatment.  You have a rash with blisters on your chest.  You have a fever. SEEK IMMEDIATE MEDICAL CARE IF:   Your chest pain is worse.  You have an increasing cough, or you cough up blood.  You have severe abdominal pain.  You have severe weakness.  You faint.  You have chills.  You have sudden, unexplained chest discomfort.  You have sudden, unexplained discomfort in your arms, back, neck, or jaw.  You have shortness of breath at any time.  You suddenly start to sweat, or your skin gets clammy.  You feel nauseous or you vomit.  You  suddenly feel light-headed or dizzy.  Your heart begins to beat quickly, or it feels like it is skipping beats. These symptoms may represent a serious problem that is an emergency. Do not wait to see if the symptoms will go away. Get medical help right away. Call your local emergency services (911 in the [place] Do not drive yourself to the hospital.   This information is not intended to replace advice given to you by your health care provider. Make sure you discuss any questions you have with your health care provider.   Document Released: 12/14/2004 Document Revised: 03/27/2014 Document Reviewed: 10/10/2013 Elsevier Interactive Patient Education [employer]2016 Elsevier Inc.

## 2015-01-08 NOTE — ED Provider Notes (Signed)
CSN: [id number]     Arrival date & time 01/07/15  1845 History   First MD Initiated Contact with Patient 01/07/15 1904     No chief complaint on file.    (Consider location/radiation/quality/duration/timing/severity/associated sxs/prior Treatment) HPI Comments: 39 y.o. Male with history of Hypertension, hypertriglyceridemia, anxiety, suicide attempts presents for chest and lower back pain.  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  Reports the pain in both areas is sharp and does not appear to radiate.  Denies neurologic changes.  Pain not worse with deep breathing or movement.  No shortness of breath.  No recent fever, cough, congestion.  Denies recreational drug use.  No recent travel or immobility.   Past Medical History  Diagnosis Date  . ADD (attention deficit disorder)   . Hyperlipidemia   . Hypertension   . Pneumonia   . Anxiety   . Complication of anesthesia   . PONV (postoperative nausea and vomiting)   . Kidney stones    Past Surgical History  Procedure Laterality Date  . Orthopedic surgery      right clavicle  . Orif clavicular fracture Left 09/10/2014    Procedure: OPEN REDUCTION INTERNAL FIXATION (ORIF) CLAVICULAR FRACTURE;  Surgeon: Jones Broom, MD;  Location: [facility] OR;  Service: Orthopedics;  Laterality: Left;  Left Open reduction internal fixatin clavical  . Orif clavicular fracture Left 10/22/2014    Procedure: OPEN REDUCTION INTERNAL FIXATION (ORIF) CLAVICULAR FRACTURE, HARDWARE REMOVAL ;  Surgeon: Jones Broom, MD;  Location: [facility];  Service: Orthopedics;  Laterality: Left;  Open reduction internal fixation left clavical fracture with left clavical hardware removal   Family History  Problem Relation Age of Onset  . Stroke Mother   . Heart attack Maternal Grandfather   . Heart attack Paternal Grandmother    . Stroke Paternal Grandmother   . Fibromyalgia Father    Social History  Substance Use Topics  . Smoking status: Never Smoker   . Smokeless tobacco: Never Used  . Alcohol Use: Yes     Comment: daily sometimes up to 4 shots of rum a day    Review of Systems  Constitutional: Negative for fever, chills, diaphoresis and fatigue.  HENT: Negative for congestion, ear discharge, ear pain, postnasal drip and rhinorrhea.   Eyes: Negative for pain and redness.  Respiratory: Negative for cough, chest tightness, shortness of breath and wheezing.   Cardiovascular: Positive for chest pain. Negative for palpitations and leg swelling.  Gastrointestinal: Negative for nausea, vomiting, abdominal pain, diarrhea and constipation.  Genitourinary: Negative for dysuria, urgency, frequency and hematuria.  Musculoskeletal: Positive for back pain. Negative for myalgias, neck pain and neck stiffness.  Skin: Negative for rash.  Neurological: Negative for dizziness, weakness and headaches.  Hematological: Does not bruise/bleed easily.      Allergies  Yellow jacket venom and Percocet  Home Medications   Prior to Admission medications   Medication Sig Start Date End Date Taking? Authorizing Provider  amphetamine-dextroamphetamine (ADDERALL XR) 25 MG 24 hr capsule Take 1 capsule by mouth every morning. 12/24/14  Yes Sheliah Hatch, MD  fenofibrate 160 MG tablet TAKE 1 TABLET BY MOUTH ONCE DAILY 12/16/14  Yes Beau Fanny, FNP  hydrOXYzine (ATARAX/VISTARIL) 25 MG tablet Take 1 tablet (25 mg total) by mouth 3 (three) times daily as needed for anxiety. 12/16/14  Yes Beau FannyJohn C Withrow, FNP  irbesartan (AVAPRO) 75 MG tablet Take 0.5 tablets (37.5 mg total) by mouth daily. 12/16/14  Yes Beau FannyJohn C Withrow, FNP  lurasidone (LATUDA) 40 MG TABS tablet Take 1 tablet (40 mg total) by mouth daily with breakfast. 12/16/14  Yes Beau FannyJohn C Withrow, FNP  mirtazapine (REMERON) 15 MG tablet Take 1 tablet (15 mg total) by mouth at  bedtime. 12/16/14  Yes Beau FannyJohn C Withrow, FNP  valsartan (DIOVAN) 80 MG tablet Take 80 mg by mouth daily.   Yes Historical Provider, MD   BP 140/68 mmHg  Pulse 67  Temp(Src) 98.3 F (36.8 C) (Oral)  Resp 10  SpO2 100% Physical Exam  Constitutional: He is oriented to person, place, and time. He appears well-developed and well-nourished. No distress.  HENT:  Head: Normocephalic and atraumatic.  Right Ear: External ear normal.  Left Ear: External ear normal.  Mouth/Throat: Oropharynx is clear and moist. No oropharyngeal exudate.  Eyes: EOM are normal. Pupils are equal, round, and reactive to light.  Neck: Normal range of motion. Neck supple.  Cardiovascular: Normal rate, regular rhythm, normal heart sounds and intact distal pulses.   No murmur heard. Pulses:      Radial pulses are 2+ on the right side, and 2+ on the left side.  Pulmonary/Chest: Effort normal. No respiratory distress. He has no wheezes. He has no rales.  Abdominal: Soft. He exhibits no distension. There is no tenderness.  Musculoskeletal: He exhibits no edema.  Neurological: He is alert and oriented to person, place, and time. He has normal strength. No sensory deficit.  Skin: Skin is warm and dry. No rash noted. He is not diaphoretic.  Vitals reviewed.   ED Course  Procedures (including critical care time) Labs Review Labs Reviewed  BASIC METABOLIC PANEL - Abnormal; Notable for the following:    Glucose, Bld 102 (*)    All other components within normal limits  CBC - Abnormal; Notable for the following:    RBC 4.12 (*)    Hemoglobin 12.7 (*)    HCT 38.9 (*)    All other components within normal limits  URINALYSIS, ROUTINE W REFLEX MICROSCOPIC (NOT AT [facility]) - Abnormal; Notable for the following:    APPearance CLOUDY (*)    Hgb urine dipstick MODERATE (*)    All other components within normal limits  TROPONIN I  URINE MICROSCOPIC-ADD ON  Rosezena SensorI-STAT TROPOININ, ED  Rosezena SensorI-STAT TROPOININ, ED    Imaging Review Dg Chest  2 View  01/07/2015  CLINICAL DATA:  Sharp chest pain today. EXAM: CHEST  2 VIEW COMPARISON:  10/09/2012 FINDINGS: The cardiac silhouette, mediastinal and hilar contours are within normal limits and stable. The lungs are clear. Bilateral nipple shadows are noted. No worrisome pulmonary lesions. No pleural effusion. The bony thorax is intact. Plate and screws are noted on both clavicles. IMPRESSION: No acute cardiopulmonary findings. Electronically Signed   By: Rudie MeyerP.  Gallerani M.D.   On: 01/07/2015 19:33   Ct Renal Stone Study  01/07/2015  CLINICAL DATA:  Acute onset of right flank pain and left lower back pain. Left-sided chest pain. Initial encounter. EXAM: CT ABDOMEN AND PELVIS WITHOUT CONTRAST TECHNIQUE: Multidetector CT imaging of the abdomen and pelvis was performed following the standard protocol without IV contrast. COMPARISON:  None. FINDINGS: The visualized lung bases are clear.  A small hiatal hernia is seen. The liver and spleen are unremarkable in appearance. The gallbladder is within normal limits. The pancreas and adrenal glands are unremarkable. The  kidneys are unremarkable in appearance. There is no evidence of hydronephrosis. No renal or ureteral stones are seen. No perinephric stranding is appreciated. No free fluid is identified. The small bowel is unremarkable in appearance. The stomach is within normal limits. No acute vascular abnormalities are seen. The appendix is normal in caliber, without evidence of appendicitis. The colon is unremarkable in appearance. The bladder is mildly distended and grossly unremarkable. The prostate remains normal in size. No inguinal lymphadenopathy is seen. No acute osseous abnormalities are identified. IMPRESSION: 1. No acute abnormality seen within the abdomen or pelvis. 2. Small hiatal hernia noted. Electronically Signed   By: Roanna Raider M.D.   On: 01/07/2015 21:50   I have personally reviewed and evaluated these images and lab results as part of my  medical decision-making.   EKG Interpretation   Date/Time:  Friday January 08 2015 00:13:05 EDT Ventricular Rate:  76 PR Interval:  157 QRS Duration: 157 QT Interval:  427 QTC Calculation: 480 R Axis:   25 Text Interpretation:  Sinus rhythm Right bundle branch block No  significant change since last tracing Confirmed by NGUYEN, EMILY ([id number])  on 01/08/2015 7:48:31 AM      MDM  Patient seen and evaluated in stable condition.  Patient well appearing at time of evaluated with stable vitals.  EKGs were reviewed by telephone with Dr. Jacinto Halim who felt pattern most consistent with early repolarization and did not feel there were signs of MI or significant changes from previous EKG.  Normal pulses and BP without significant elevation.  Patient PERC negative.  Troponins x2 normal.  Chest xray unremarkable.  Urinalysis without sign of infection and patient without acute finding on abdominal CT.  Patient was given ASA but pain and symptoms resolved spontaneously on their own.  Unclear etiology but patient with heart score of 3 making patient low risk for major adverse cardiac event and with spontaneous resolution of symptoms and patient remained well appearing.  Discussed with patient and wife all results at length who expressed understanding and agreement with plan for discharge and outpatient follow up.  Patient was discharged home in stable condition. Final diagnoses:  Right flank pain  Chest pain, unspecified chest pain type    1. Chest pain, unclear etiology  2. Low back pain, unclear etiology    Leta Baptist, MD 01/08/15 [phone]

## 2015-01-08 NOTE — ED Notes (Signed)
Patient & wife offered a drink, declined. Updated patient and wife to dispo status.

## 2015-01-12 ENCOUNTER — Telehealth: Payer: Self-pay | Admitting: Family Medicine

## 2015-01-12 MED ORDER — IRBESARTAN 75 MG PO TABS: 37.5000 mg | ORAL_TABLET | Freq: Every day | ORAL | Status: DC

## 2015-01-12 NOTE — Telephone Encounter (Signed)
Caller name: Murlean Calleraron Cunanan   Relationship to patient: Spouse   Can be reached: [phone]  Pharmacy: [id number] - [place], [place] - [address]  Reason for call: She is requesting a refill on his Valsartan Rx.

## 2015-01-12 NOTE — Telephone Encounter (Signed)
Per PCP last notes and Hospital notes, pt is no longer on valsartan but instead is on irbesartan. This was the medication filled today.

## 2015-01-25 ENCOUNTER — Telehealth: Payer: Self-pay | Admitting: Family Medicine

## 2015-01-25 MED ORDER — LURASIDONE HCL 40 MG PO TABS: 40.0000 mg | ORAL_TABLET | Freq: Every day | ORAL | Status: DC

## 2015-01-25 MED ORDER — MIRTAZAPINE 15 MG PO TABS: 15.0000 mg | ORAL_TABLET | Freq: Every day | ORAL | Status: AC

## 2015-01-25 MED ORDER — HYDROXYZINE HCL 25 MG PO TABS: 25.0000 mg | ORAL_TABLET | Freq: Three times a day (TID) | ORAL | Status: AC | PRN

## 2015-01-25 NOTE — Telephone Encounter (Signed)
Relation to YN:WGNFpt:self Call back [phone] Pharmacy: [id number] - [place], [place] - [address] [facility] [address] [phone] (Phone) [phone] (Fax)         Reason for call:  Patient requesting a refill,patient is completely out of medication:  lurasidone (LATUDA) 40 MG TABS tablet  hydrOXYzine (ATARAX/VISTARIL) 25 MG tablet mirtazapine (REMERON) 15 MG tablet

## 2015-01-25 NOTE — Telephone Encounter (Signed)
Last ov 12/24/14 Latuda last filled 12/16/14 #30 with 0 Hydroxyzine last filled 12/16/14 #30 with 0 remeron last filled 12/16/14 #30 with 0   Please advise, last filled by behavioral health

## 2015-01-25 NOTE — Telephone Encounter (Signed)
Medication filled to pharmacy as requested.   

## 2015-01-25 NOTE — Telephone Encounter (Signed)
Ok for #30, 3 refills 

## 2015-01-26 ENCOUNTER — Encounter: Payer: Self-pay | Admitting: Family Medicine

## 2015-01-26 ENCOUNTER — Ambulatory Visit (INDEPENDENT_AMBULATORY_CARE_PROVIDER_SITE_OTHER): Payer: 59 | Admitting: Family Medicine

## 2015-01-26 ENCOUNTER — Other Ambulatory Visit: Payer: Self-pay | Admitting: General Practice

## 2015-01-26 VITALS — BP 124/74 | HR 62 | Temp 98.1°F | Resp 16 | Ht 71.0 in | Wt 188.5 lb

## 2015-01-26 DIAGNOSIS — F332 Major depressive disorder, recurrent severe without psychotic features: Secondary | ICD-10-CM | POA: Diagnosis not present

## 2015-01-26 MED ORDER — AMPHETAMINE-DEXTROAMPHET ER 25 MG PO CP24: 25.0000 mg | ORAL_CAPSULE | ORAL | Status: DC

## 2015-01-26 MED ORDER — IRBESARTAN 75 MG PO TABS: 75.0000 mg | ORAL_TABLET | Freq: Every day | ORAL | Status: DC

## 2015-01-26 NOTE — Progress Notes (Signed)
   Subjective:    Patient ID: Keith CostainDaniel T Johnson, male    DOB: 05/11/1975, 39 y.o.   MRN: [id number]  HPI MDD- pt was out of meds x5 days, restarted this morning.  Pt admits that things are darker than they were at last visit.  Had recurrent thoughts of hurting himself but reports those have resolved.  Was also sleeping poorly while off medication.  Pt now aware that he cannot run out of meds.  Pt is currently going through a divorce- having a hard time with it.   Review of Systems For ROS see HPI     Objective:   Physical Exam  Constitutional: He is oriented to person, place, and time. He appears well-developed and well-nourished.  HENT:  Head: Normocephalic and atraumatic.  Neurological: He is alert and oriented to person, place, and time.  Psychiatric: Thought content normal.  Intermittently tearful during visit.  Otherwise flat affect. Able to contract for safety  Vitals reviewed.         Assessment & Plan:

## 2015-01-26 NOTE — Progress Notes (Signed)
Pre visit review using our clinic review tool, if applicable. No additional management support is needed unless otherwise documented below in the visit note. 

## 2015-01-26 NOTE — Patient Instructions (Signed)
Follow up in 1 month to recheck mood Continue all medications I increased the Avapro to 1 tab daily b/c BP looks good If you have worsening depression, dark or dangerous thoughts, or any other concerns please reach out to me, Behavioral Health or the ER Call with any questions or concerns Hang in there!!

## 2015-01-26 NOTE — Assessment & Plan Note (Addendum)
Deteriorated.  He recently ran out of meds and was off all medication x5 days.  He is now aware that he cannot stop his medication b/c mood dramatically declined.  Pt is not actively suicidal but does admit to some suicidal thoughts in the last week.  Able to contract for safety.  Not interested in counseling despite me suggesting this repeatedly.  Not interested in changing meds.  York SpanielSaid he would consider seeing a psychiatrist but prefers to continue seeing me.  Will follow closely.

## 2015-01-29 ENCOUNTER — Ambulatory Visit (INDEPENDENT_AMBULATORY_CARE_PROVIDER_SITE_OTHER): Payer: 59 | Admitting: Cardiology

## 2015-01-29 ENCOUNTER — Encounter: Payer: Self-pay | Admitting: Cardiology

## 2015-01-29 VITALS — BP 120/80 | HR 83 | Ht 71.0 in | Wt 190.0 lb

## 2015-01-29 DIAGNOSIS — R9431 Abnormal electrocardiogram [ECG] [EKG]: Secondary | ICD-10-CM | POA: Diagnosis not present

## 2015-01-29 NOTE — Progress Notes (Signed)
Cardiology Office Note   Date:  01/29/2015   ID:  Keith Johnson, DOB 1975-12-06, MRN [id number]  PCP:  Neena Rhymes, MD  Cardiologist:   Rollene Rotunda, MD   Chief Complaint  Patient presents with  . New Patient (Initial Visit)  . Dizziness      History of Present Illness: Keith Johnson is a 39 y.o. male who presents for evaluation of chest back pain and abnormal EKG.   The patient has had no past cardiac history. However, he was in the emergency room on 1020 and I reviewed these records. He has very sharp discomfort in his chest when around somewhat to his back on the left side. He thought he had a kidney stone. However, he had a CT that did not demonstrated kidney stone was a small hiatal hernia. He was told he had "a small heart attack". However, cardiac enzymes were unremarkable. He did have a right bundle branch block on EKG and I was able to look back earlier this year and he has had this as well. He has not had further chest discomfort. He has not had new palpitations, presyncope or syncope. He has had no weight gain or edema.   He has a physical job. He rides his bike to work. Without he's not able to bring on symptoms. He thinks he gets more fatigued than he should. However, he doesn't have exceptional dyspnea and is not describing palpitations, presyncope or syncope. She's not having any PND or orthopnea.  Past Medical History  Diagnosis Date  . ADHD (attention deficit hyperactivity disorder)   . Hyperlipidemia   . Hypertension   . Pneumonia   . Anxiety   . Complication of anesthesia   . PONV (postoperative nausea and vomiting)   . Kidney stones   . MDD (major depressive disorder) (HCC)   . Depression     Past Surgical History  Procedure Laterality Date  . Orthopedic surgery      right clavicle  . Orif clavicular fracture Left 09/10/2014    Procedure: OPEN REDUCTION INTERNAL FIXATION (ORIF) CLAVICULAR FRACTURE;  Surgeon: Jones Broom, MD;  Location:  [facility] OR;  Service: Orthopedics;  Laterality: Left;  Left Open reduction internal fixatin clavical  . Orif clavicular fracture Left 10/22/2014    Procedure: OPEN REDUCTION INTERNAL FIXATION (ORIF) CLAVICULAR FRACTURE, HARDWARE REMOVAL ;  Surgeon: Jones Broom, MD;  Location: [facility];  Service: Orthopedics;  Laterality: Left;  Open reduction internal fixation left clavical fracture with left clavical hardware removal     Current Outpatient Prescriptions  Medication Sig Dispense Refill  . amphetamine-dextroamphetamine (ADDERALL XR) 25 MG 24 hr capsule Take 1 capsule by mouth every morning. 30 capsule 0  . fenofibrate 160 MG tablet TAKE 1 TABLET BY MOUTH ONCE DAILY 90 tablet 1  . hydrOXYzine (ATARAX/VISTARIL) 25 MG tablet Take 1 tablet (25 mg total) by mouth 3 (three) times daily as needed for anxiety. 30 tablet 3  . irbesartan (AVAPRO) 75 MG tablet Take 1 tablet (75 mg total) by mouth daily. 30 tablet 6  . lurasidone (LATUDA) 40 MG TABS tablet Take 1 tablet (40 mg total) by mouth daily with breakfast. 30 tablet 3  . mirtazapine (REMERON) 15 MG tablet Take 1 tablet (15 mg total) by mouth at bedtime. 30 tablet 3   No current facility-administered medications for this visit.    Allergies:   Yellow jacket venom and Percocet    Social History:  The patient  reports  that he has never smoked. He has never used smokeless tobacco. He reports that he drinks alcohol. He reports that he does not use illicit drugs.   Family History:  The patient's family history includes Fibromyalgia in his father; Heart attack in his maternal grandfather and paternal grandmother; Stroke in his paternal grandmother; Stroke (age of onset: 7057) in his mother.    ROS:  Please see the history of present illness.   Otherwise, review of systems are positive for none.   All other systems are reviewed and negative.    PHYSICAL EXAM: VS:  BP 120/80 mmHg  Pulse 83  Ht 5\' 11"  (1.803 m)  Wt 190 lb (86.183 kg)   BMI 26.51 kg/m2 , BMI Body mass index is 26.51 kg/(m^2). GENERAL:  Well appearing HEENT:  Pupils equal round and reactive, fundi not visualized, oral mucosa unremarkable NECK:  No jugular venous distention, waveform within normal limits, carotid upstroke brisk and symmetric, no bruits, no thyromegaly LYMPHATICS:  No cervical, inguinal adenopathy LUNGS:  Clear to auscultation bilaterally BACK:  No CVA tenderness CHEST:  Unremarkable HEART:  PMI not displaced or sustained,S1 and S2 within normal limits, no S3, no S4, no clicks, no rubs, no murmurs ABD:  Flat, positive bowel sounds normal in frequency in pitch, no bruits, no rebound, no guarding, no midline pulsatile mass, no hepatomegaly, no splenomegaly EXT:  2 plus pulses throughout, no edema, no cyanosis no clubbing SKIN:  No rashes no nodules NEURO:  Cranial nerves II through XII grossly intact, motor grossly intact throughout PSYCH:  Cognitively intact, oriented to person place and time    EKG:  EKG is not ordered today. The ekg ordered 10/20 demonstrates  Normal sinus rhythm , rate 86, right bundle branch block   Recent Labs: 12/10/2014: ALT 15* 12/16/2014: TSH 1.297 01/07/2015: BUN 14; Creatinine, Ser 1.09; Hemoglobin 12.7*; Platelets 274; Potassium 3.5; Sodium 138    Lipid Panel    Component Value Date/Time   CHOL 147 12/16/2014 0645   TRIG 114 12/16/2014 0645   HDL 49 12/16/2014 0645   CHOLHDL 3.0 12/16/2014 0645   VLDL 23 12/16/2014 0645   LDLCALC 75 12/16/2014 0645   LDLDIRECT 105.0 08/19/2014 1551      Wt Readings from Last 3 Encounters:  01/29/15 190 lb (86.183 kg)  01/26/15 188 lb 8 oz (85.503 kg)  12/24/14 181 lb 2 oz (82.158 kg)      Other studies Reviewed: Additional studies/ records that were reviewed today include:  Abnormal EKG. Review of the above records demonstrates:  Please see elsewhere in the note.     ASSESSMENT AND PLAN:  RBBB:  I will order an echo.  If this is normal then no further  evaluation is needed.  CHEST PAIN:  This was atypical.  He has not had a recurrence of this severe pain.  No further workup is indicated.  The pretest probability of obstructive CAD is very low.    Current medicines are reviewed at length with the patient today.  The patient does not have concerns regarding medicines.  The following changes have been made:  no change  Labs/ tests ordered today include:   Orders Placed This Encounter  Procedures  . ECHOCARDIOGRAM COMPLETE     Disposition:   FU with me as needed.      Signed, Rollene RotundaJames Daryle Amis, MD  01/29/2015 11:58 AM    [facility]

## 2015-01-29 NOTE — Patient Instructions (Addendum)
Your physician recommends that you schedule a follow-up appointment As Needed  Your physician has requested that you have an echocardiogram. Echocardiography is a painless test that uses sound waves to create images of your heart. It provides your doctor with information about the size and shape of your heart and how well your heart's chambers and valves are working. This procedure takes approximately one hour. There are no restrictions for this procedure.    

## 2015-02-08 ENCOUNTER — Telehealth: Payer: Self-pay | Admitting: Family Medicine

## 2015-02-08 ENCOUNTER — Ambulatory Visit (HOSPITAL_COMMUNITY): Payer: 59 | Attending: Cardiology

## 2015-02-08 ENCOUNTER — Other Ambulatory Visit: Payer: Self-pay

## 2015-02-08 DIAGNOSIS — I071 Rheumatic tricuspid insufficiency: Secondary | ICD-10-CM | POA: Insufficient documentation

## 2015-02-08 DIAGNOSIS — I340 Nonrheumatic mitral (valve) insufficiency: Secondary | ICD-10-CM | POA: Diagnosis not present

## 2015-02-08 DIAGNOSIS — I371 Nonrheumatic pulmonary valve insufficiency: Secondary | ICD-10-CM | POA: Insufficient documentation

## 2015-02-08 DIAGNOSIS — E785 Hyperlipidemia, unspecified: Secondary | ICD-10-CM | POA: Diagnosis not present

## 2015-02-08 DIAGNOSIS — R9431 Abnormal electrocardiogram [ECG] [EKG]: Secondary | ICD-10-CM | POA: Insufficient documentation

## 2015-02-08 DIAGNOSIS — I10 Essential (primary) hypertension: Secondary | ICD-10-CM | POA: Diagnosis not present

## 2015-02-08 NOTE — Telephone Encounter (Signed)
Pt wife called about his latuda. She said he's gained about 30 lbs since starting it in September. He doesn't want to stop but he's gained so much weight and wondering if there is an alternative. Call pts cell#.

## 2015-02-08 NOTE — Telephone Encounter (Signed)
Please advise? Do you want pt to come in to discuss? Last OV was 01/26/15 and no mention of this in notes

## 2015-02-09 NOTE — Telephone Encounter (Signed)
Pt has gained 14 lbs from July to November.  This is still within range for him and not concerning at this time.  All medications in this class can cause weight gain and if symptoms are well controlled and he is stable, I would not recommend switching meds at this time

## 2015-02-09 NOTE — Telephone Encounter (Signed)
Called and spoke with pt he advised that last weight at cardiology was 190, states that he does not want to be anywhere near this range. I advised pt that unfortunately even if we change medications we will possibly have the same side effects. Pt stated an understanding. I made him his 1 month follow up today for 02-22-15 and advised that we will recheck weight at that time and provider could address with pt in the office.

## 2015-02-09 NOTE — Telephone Encounter (Signed)
In the meantime, he can work on [employer] diet and regular exercise

## 2015-02-20 ENCOUNTER — Other Ambulatory Visit: Payer: Self-pay | Admitting: Family Medicine

## 2015-02-20 NOTE — Telephone Encounter (Signed)
Medication filled to pharmacy as requested.   

## 2015-02-22 ENCOUNTER — Ambulatory Visit (INDEPENDENT_AMBULATORY_CARE_PROVIDER_SITE_OTHER): Payer: 59 | Admitting: Family Medicine

## 2015-02-22 ENCOUNTER — Encounter: Payer: Self-pay | Admitting: Family Medicine

## 2015-02-22 VITALS — BP 124/82 | HR 83 | Temp 98.1°F | Resp 16 | Ht 71.0 in | Wt 191.0 lb

## 2015-02-22 DIAGNOSIS — F332 Major depressive disorder, recurrent severe without psychotic features: Secondary | ICD-10-CM

## 2015-02-22 MED ORDER — QUETIAPINE FUMARATE 100 MG PO TABS: ORAL_TABLET | ORAL | Status: DC

## 2015-02-22 NOTE — Progress Notes (Signed)
   Subjective:    Patient ID: Keith Johnson, male    DOB: [birth year], 39 y.o.   MRN: [id number]  HPI Depression- pt is no longer taking Latuda bc 'it made me gain weight and it makes me super unmotivated and tired'.  Pt has hx of bipolar disorder but depression tends to predominate.  Had recent suicide attempt.  Not taking Remeron consistently.  Denies SI/HI.  Still having issues w/ marriage.   Review of Systems For ROS see HPI     Objective:   Physical Exam  Constitutional: He is oriented to person, place, and time. He appears well-developed and well-nourished. No distress.  HENT:  Head: Normocephalic and atraumatic.  Eyes: Conjunctivae and EOM are normal. Pupils are equal, round, and reactive to light.  Neurological: He is alert and oriented to person, place, and time.  Skin: Skin is warm and dry.  Psychiatric:  Flat affect Withdrawn Limited insight into his dx as evidenced by fact he is not taking medication despite recent suicide attempt  Vitals reviewed.         Assessment & Plan:

## 2015-02-22 NOTE — Progress Notes (Signed)
Pre visit review using our clinic review tool, if applicable. No additional management support is needed unless otherwise documented below in the visit note. 

## 2015-02-22 NOTE — Patient Instructions (Signed)
Follow up in 1 month to recheck mood (new office) STOP the Latuda START the Seroquel as directed Call and schedule an appt with a psychiatrist for medication management Call with any questions or concerns Happy Holidays! Hang in there!!!

## 2015-02-22 NOTE — Assessment & Plan Note (Signed)
Deteriorated.  Pt is again struggling b/c he is refusing to take his Latuda due to side effects of weight gain, fatigue, and decreased motivation.  Stressed that given his recent suicide attempt and his hx of bipolar d/o, he really needs to see a psychiatrist for ongoing med management (which he has refused in the past).  Will stop Latuda and start Seroquel nightly.  Pt given names and #s of psychiatrists to call and establish with.  Will follow closely.

## 2015-03-25 ENCOUNTER — Ambulatory Visit: Payer: 59 | Admitting: Family Medicine

## 2015-04-05 ENCOUNTER — Encounter: Payer: Self-pay | Admitting: Family Medicine

## 2015-04-05 ENCOUNTER — Ambulatory Visit (INDEPENDENT_AMBULATORY_CARE_PROVIDER_SITE_OTHER): Payer: 59 | Admitting: Family Medicine

## 2015-04-05 VITALS — BP 148/96 | HR 98 | Temp 98.4°F | Resp 16 | Ht 71.0 in | Wt 194.0 lb

## 2015-04-05 DIAGNOSIS — F332 Major depressive disorder, recurrent severe without psychotic features: Secondary | ICD-10-CM | POA: Diagnosis not present

## 2015-04-05 DIAGNOSIS — F909 Attention-deficit hyperactivity disorder, unspecified type: Secondary | ICD-10-CM | POA: Diagnosis not present

## 2015-04-05 DIAGNOSIS — F988 Other specified behavioral and emotional disorders with onset usually occurring in childhood and adolescence: Secondary | ICD-10-CM

## 2015-04-05 DIAGNOSIS — I10 Essential (primary) hypertension: Secondary | ICD-10-CM

## 2015-04-05 MED ORDER — IRBESARTAN 75 MG PO TABS: 75.0000 mg | ORAL_TABLET | Freq: Every day | ORAL | Status: AC

## 2015-04-05 MED ORDER — FENOFIBRATE 160 MG PO TABS: 160.0000 mg | ORAL_TABLET | Freq: Every day | ORAL | Status: AC

## 2015-04-05 MED ORDER — QUETIAPINE FUMARATE 300 MG PO TABS: 300.0000 mg | ORAL_TABLET | Freq: Every day | ORAL | Status: DC

## 2015-04-05 MED ORDER — AMPHETAMINE-DEXTROAMPHET ER 25 MG PO CP24: 25.0000 mg | ORAL_CAPSULE | ORAL | Status: DC

## 2015-04-05 NOTE — Assessment & Plan Note (Signed)
Chronic problem.  Pt is doing much better on Seroquel.  Will change medication to 300mg  daily so he doesn't have to take 3 pills nightly.  No other changes at this time.

## 2015-04-05 NOTE — Patient Instructions (Signed)
Follow up in 3 months to recheck BP RESTART the BP meds daily Seroquel is now 1 tab nightly (300mg ) Call with any questions or concerns Happy New Year!!!

## 2015-04-05 NOTE — Progress Notes (Signed)
   Subjective:    Patient ID: Davene CostainDaniel T Probasco, male    DOB: October 15, 1975, 40 y.o.   MRN: [id number]  HPI HTN- chronic problem, pt reports running out of medication 2 weeks ago.  No CP, SOB, HAs, visual changes, edema.  Depression- chronic problem, on Seroquel daily.  Pt reports he doesn't need the Remeron.  Pt reports mood has been 'pretty much ok'.  Denies SI/HI.  ADD- out of adderall.  Needs refill.   Review of Systems For ROS see HPI     Objective:   Physical Exam  Constitutional: He is oriented to person, place, and time. He appears well-developed and well-nourished. No distress.  HENT:  Head: Normocephalic and atraumatic.  Eyes: Conjunctivae and EOM are normal. Pupils are equal, round, and reactive to light.  Neck: Normal range of motion. Neck supple. No thyromegaly present.  Cardiovascular: Normal rate, regular rhythm, normal heart sounds and intact distal pulses.   No murmur heard. Pulmonary/Chest: Effort normal and breath sounds normal. No respiratory distress.  Abdominal: Soft. Bowel sounds are normal. He exhibits no distension.  Musculoskeletal: He exhibits no edema.  Lymphadenopathy:    He has no cervical adenopathy.  Neurological: He is alert and oriented to person, place, and time. No cranial nerve deficit.  Skin: Skin is warm and dry.  Psychiatric: He has a normal mood and affect. His behavior is normal.  Vitals reviewed.         Assessment & Plan:

## 2015-04-05 NOTE — Assessment & Plan Note (Signed)
Chronic problem.  Due for refill.  No med changes at this time.

## 2015-04-05 NOTE — Assessment & Plan Note (Signed)
Deteriorated.  Stressed need for pt to take BP meds regularly and not let himself run out.  Asymptomatic at this time.  Refill provided on medication.  Will follow at future visits.

## 2015-04-05 NOTE — Progress Notes (Signed)
Pre visit review using our clinic review tool, if applicable. No additional management support is needed unless otherwise documented below in the visit note. 

## 2015-05-11 ENCOUNTER — Telehealth: Payer: Self-pay | Admitting: Family Medicine

## 2015-05-11 NOTE — Telephone Encounter (Signed)
Ok for #30.  Will hold on 90 day supply as we continue to work on his depression and anxiety

## 2015-05-11 NOTE — Telephone Encounter (Signed)
Caller name: Denny Peon   Relationship to patient: Spouse   Can be reached: [phone]  Or [phone]  Reason for call: Pt is requesting a refill on his ADDERALL XR Rx. Pt would like to have a 90 day supply if possible.   Please advise when ready for pick up.     Thanks.

## 2015-05-12 MED ORDER — AMPHETAMINE-DEXTROAMPHET ER 25 MG PO CP24: 25.0000 mg | ORAL_CAPSULE | ORAL | Status: DC

## 2015-05-12 NOTE — Telephone Encounter (Signed)
Med filled, pt wife informed available at front desk for pick up.

## 2015-06-15 ENCOUNTER — Telehealth: Payer: Self-pay | Admitting: Family Medicine

## 2015-06-15 MED ORDER — AMPHETAMINE-DEXTROAMPHET ER 25 MG PO CP24: 25.0000 mg | ORAL_CAPSULE | ORAL | Status: AC

## 2015-06-15 NOTE — Telephone Encounter (Signed)
Ok for 90 day supply.  

## 2015-06-15 NOTE — Telephone Encounter (Signed)
Pt needs refill on adderall and would like a 90 day supply.

## 2015-06-15 NOTE — Telephone Encounter (Signed)
Last OV 04/05/15 adderall last filled 05/17/15 #30 with 0

## 2015-06-15 NOTE — Telephone Encounter (Signed)
Med filled and pt informed available at the front desk, [place] location.

## 2015-07-05 ENCOUNTER — Telehealth: Payer: Self-pay | Admitting: Family Medicine

## 2015-07-05 ENCOUNTER — Ambulatory Visit: Payer: 59 | Admitting: Family Medicine

## 2015-07-05 DIAGNOSIS — Z0289 Encounter for other administrative examinations: Secondary | ICD-10-CM

## 2015-07-06 ENCOUNTER — Encounter: Payer: Self-pay | Admitting: Family Medicine

## 2015-07-06 NOTE — Telephone Encounter (Signed)
Marked to charge and mailing no show letter °

## 2015-07-06 NOTE — Telephone Encounter (Signed)
Noted  

## 2015-07-06 NOTE — Telephone Encounter (Signed)
Yes- pt will need a charge and if there is another no-show we will have to start dismissal process

## 2015-07-06 NOTE — Telephone Encounter (Signed)
Pt was no show 07/05/15 9:45am for acute appt, this is 3rd no show since 09/2014, charge or no charge?

## 2015-07-23 ENCOUNTER — Other Ambulatory Visit: Payer: Self-pay | Admitting: General Practice

## 2015-07-23 ENCOUNTER — Other Ambulatory Visit: Payer: Self-pay | Admitting: Family Medicine

## 2015-07-23 MED ORDER — QUETIAPINE FUMARATE 200 MG PO TABS: 200.0000 mg | ORAL_TABLET | Freq: Every day | ORAL | Status: AC

## 2015-07-23 MED ORDER — AMPHETAMINE-DEXTROAMPHET ER 25 MG PO CP24: 25.0000 mg | ORAL_CAPSULE | ORAL | Status: DC

## 2015-07-23 MED ORDER — AMPHETAMINE-DEXTROAMPHET ER 25 MG PO CP24: 25.0000 mg | ORAL_CAPSULE | ORAL | Status: AC

## 2015-07-23 NOTE — Telephone Encounter (Signed)
Last OV 04/05/15 adderall last filled 06/15/15 #90 with 0

## 2015-07-23 NOTE — Telephone Encounter (Signed)
Spoke with pharmacy and the advised that on 3/30 patient only received #31 of the medication. Pt wife advised that the new medication that PCP had sent in for patient is working however patient is having a hard time waking up in the morning. Pt wife gives him the 300mg  medication at 9pm. Should they try a reduced dose or should he get the medication earlier in the evening. Patient has to be asleep within 30 minutes of taking the medication or he gets sick.

## 2015-07-23 NOTE — Telephone Encounter (Signed)
Pt needs refill on adderall and asking if Dr Beverely Lowabori would write 3 Rx for a 30 day supply due to ins. They will not cover 1 Rx for 90 day supply, Also wife has a question about the antidepressants they are both taken and would like a call back.

## 2015-07-23 NOTE — Telephone Encounter (Signed)
Ok to send in 3 Adderall scripts w/ dates Based on excessive fatigue, we will decrease the Seroquel to 200mg  nightly, #30, 6 refills (please send new script)

## 2015-08-25 ENCOUNTER — Telehealth: Payer: Self-pay | Admitting: Family Medicine

## 2015-08-25 NOTE — Telephone Encounter (Signed)
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

## 2015-08-25 NOTE — Telephone Encounter (Signed)
Pt made aware and is only needing the letter.

## 2015-08-25 NOTE — Telephone Encounter (Signed)
I can provide a letter indicating he is ok to drive on stimulants (it's actually safer) but I do not do CDL forms

## 2015-08-26 ENCOUNTER — Encounter: Payer: Self-pay | Admitting: Family Medicine

## 2015-08-26 NOTE — Telephone Encounter (Signed)
Letter printed for pt

## 2015-08-26 NOTE — Telephone Encounter (Signed)
Pt informed letter available at front desk for pick up.

## 2016-06-05 ENCOUNTER — Other Ambulatory Visit: Payer: Self-pay | Admitting: Family Medicine

## 2017-06-20 IMAGING — CR DG CHEST 2V
2 series · 2 of 2 positions shown · non-contrast
Comparison: 10/09/2012

CLINICAL DATA: Sharp chest pain today.

EXAM:
CHEST  2 VIEW

[w chest pa]
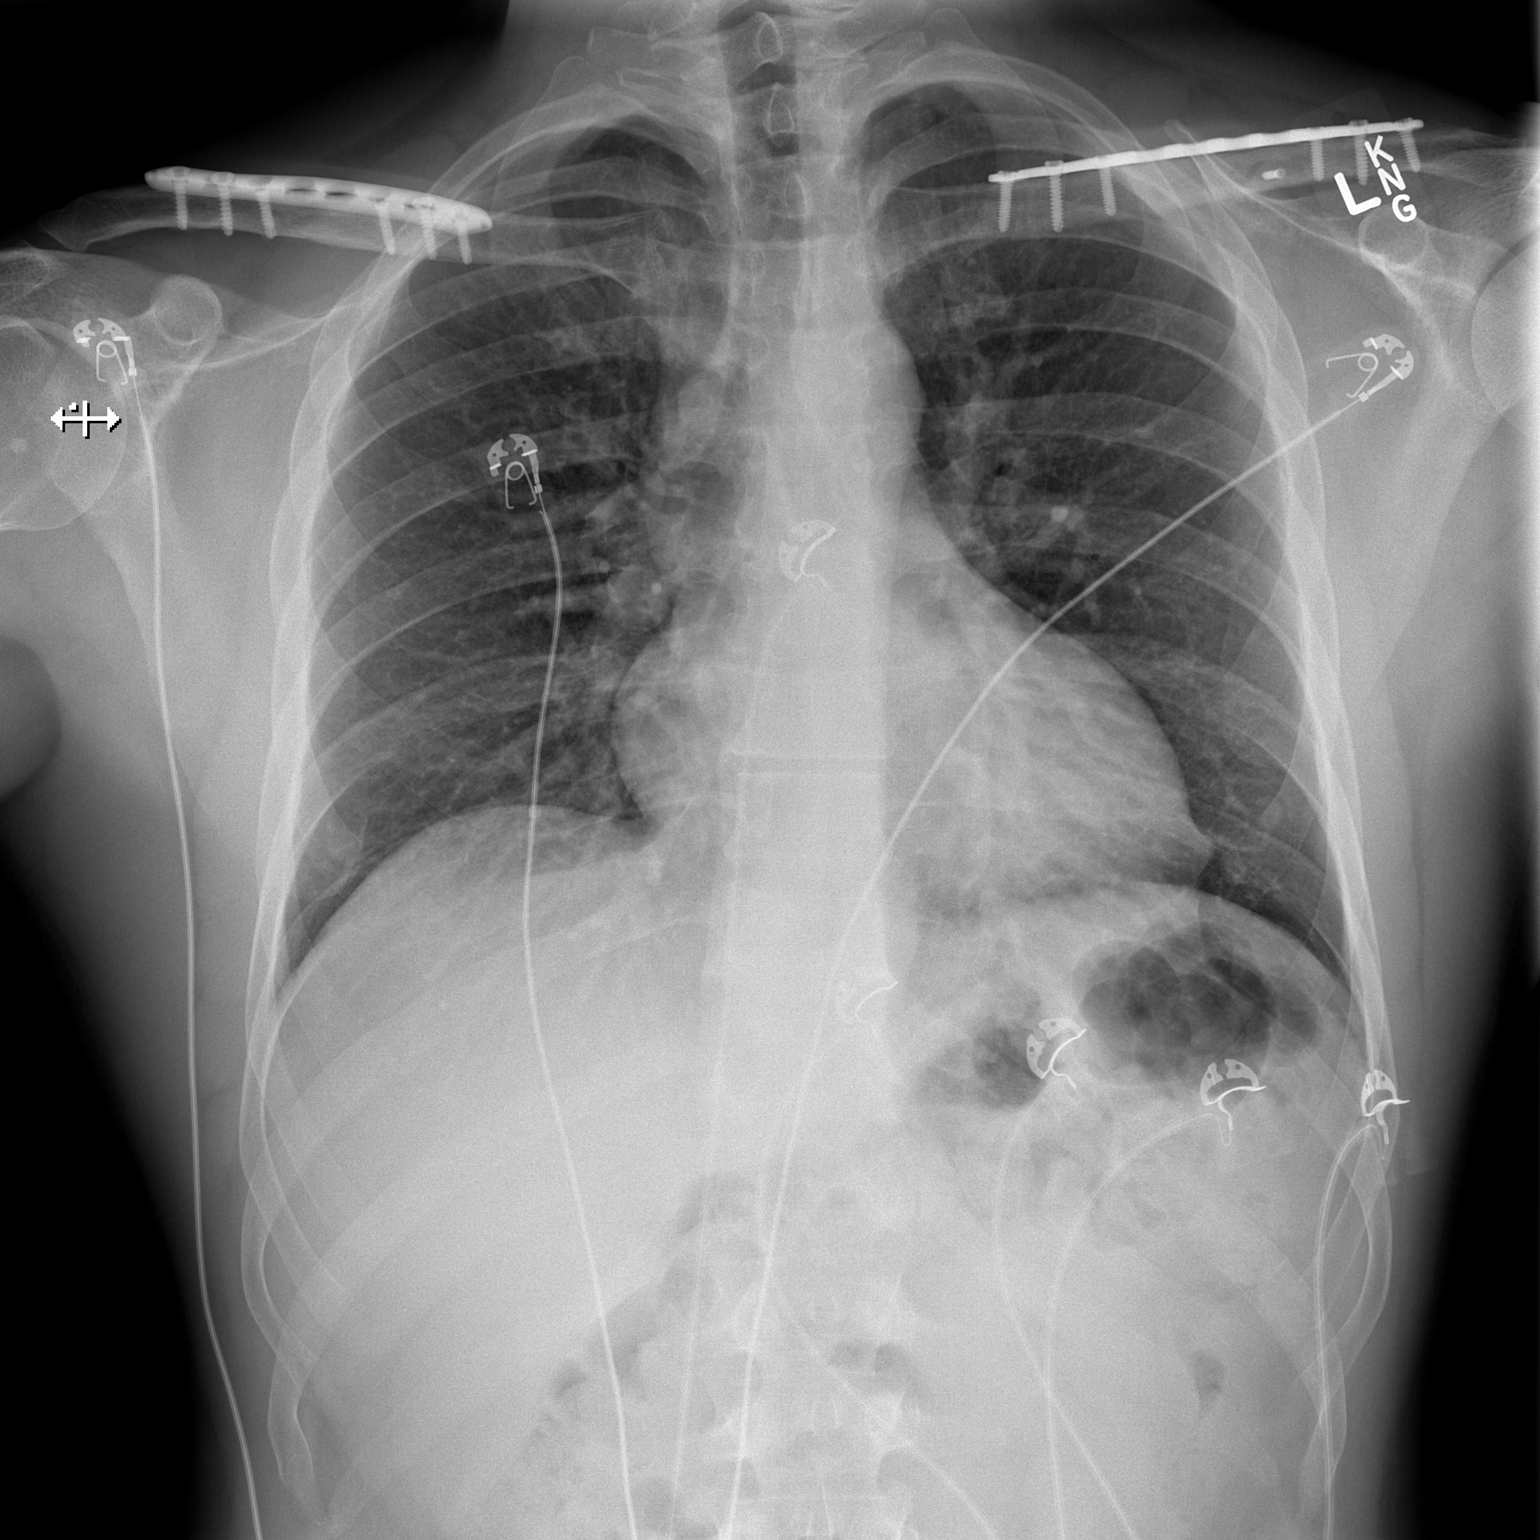

[w chest lat]
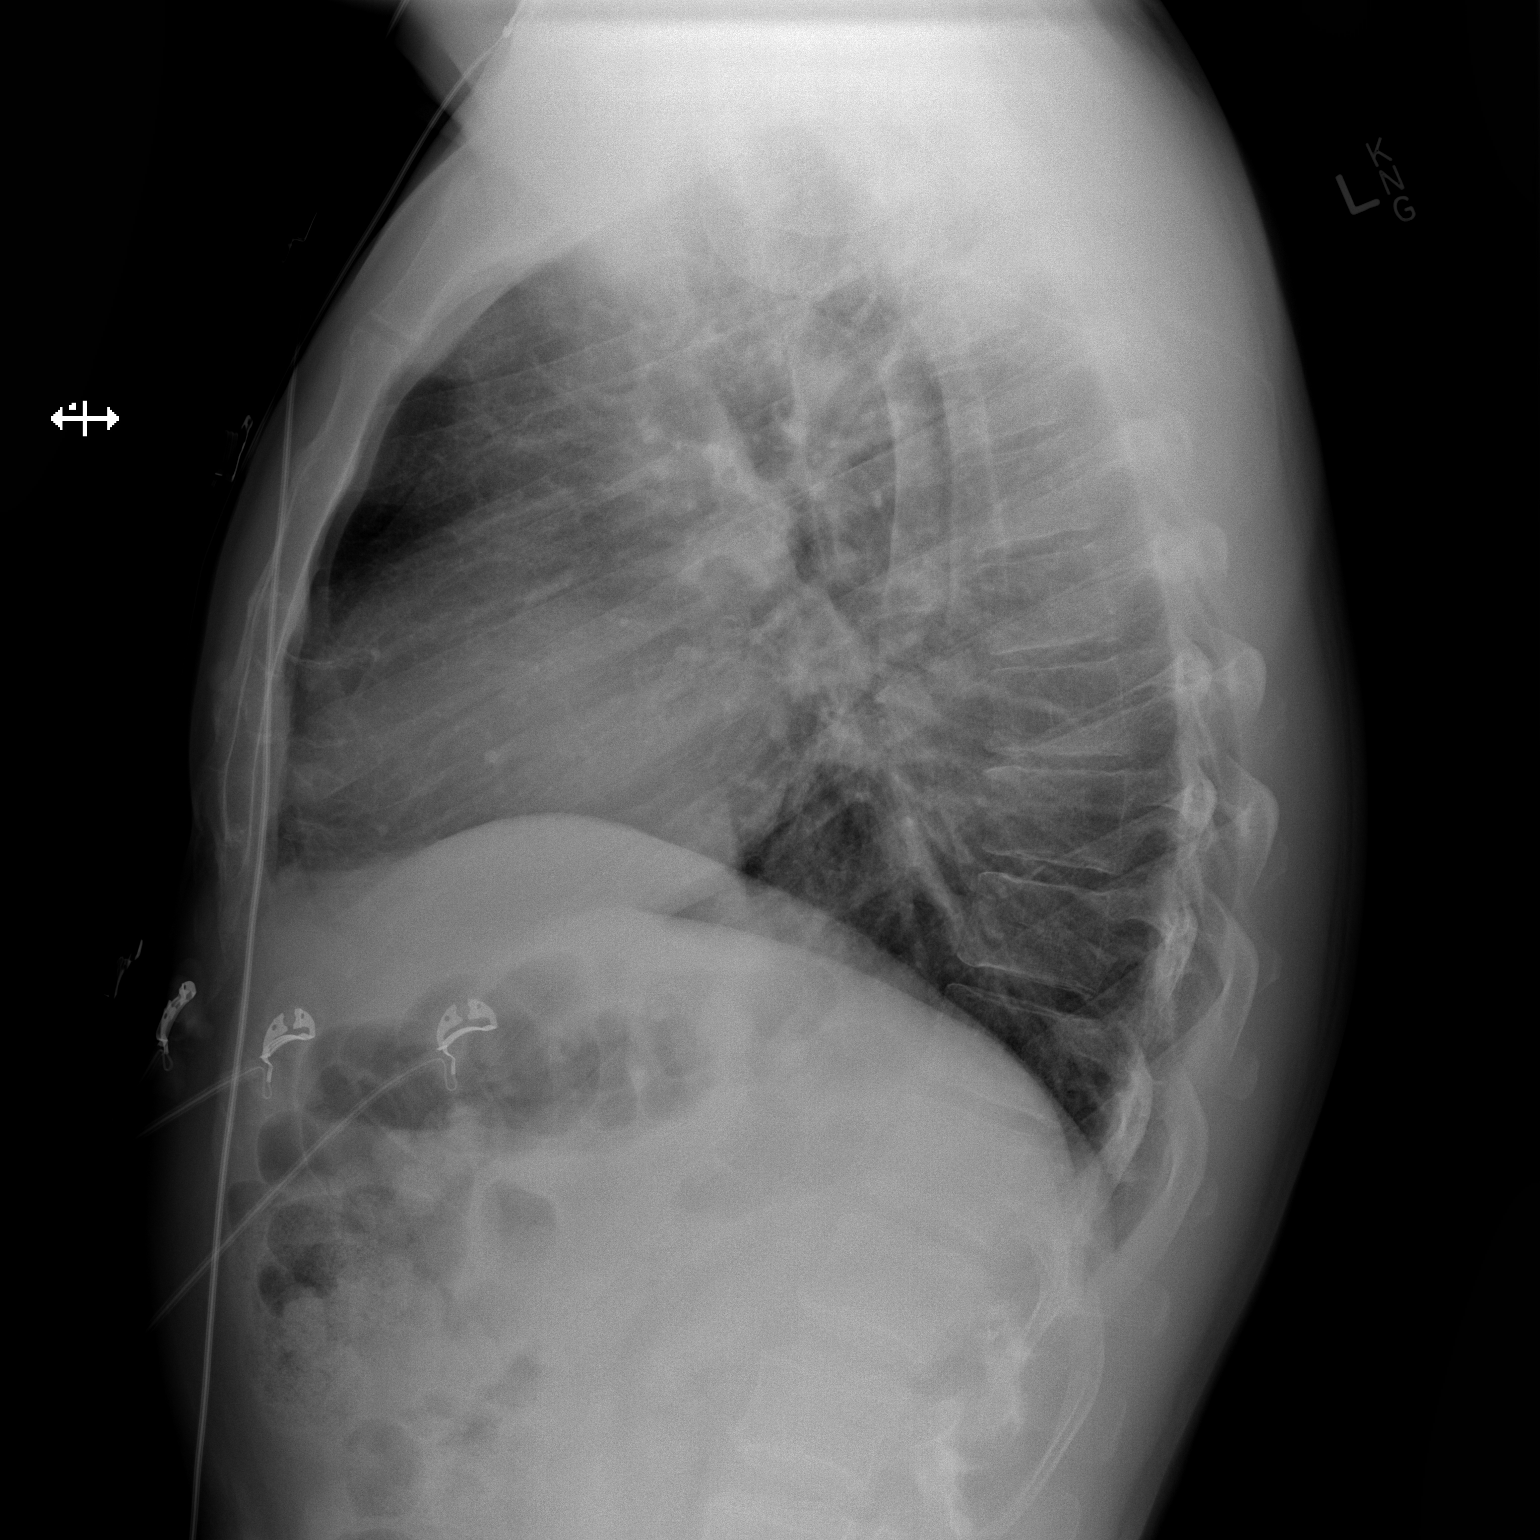

[2 of 2 positions shown; findings below may reference images not displayed]

FINDINGS: The cardiac silhouette, mediastinal and hilar contours are within
normal limits and stable. The lungs are clear. Bilateral nipple
shadows are noted. No worrisome pulmonary lesions. No pleural
effusion. The bony thorax is intact. Plate and screws are noted on
both clavicles.
IMPRESSION: No acute cardiopulmonary findings.

## 2017-06-20 IMAGING — CT CT RENAL STONE PROTOCOL
2 of 3 series · 16 of 39 positions shown, 18 images · non-contrast
Comparison: None.

CLINICAL DATA: Acute onset of right flank pain and left lower back
pain. Left-sided chest pain. Initial encounter.

EXAM:
CT ABDOMEN AND PELVIS WITHOUT CONTRAST
TECHNIQUE: Multidetector CT imaging of the abdomen and pelvis was performed
following the standard protocol without IV contrast.

[Series 3: coronal · coronal · 0.74mm/px · 3 of 83 slices shown]
[im 28/83  soft-tissue]
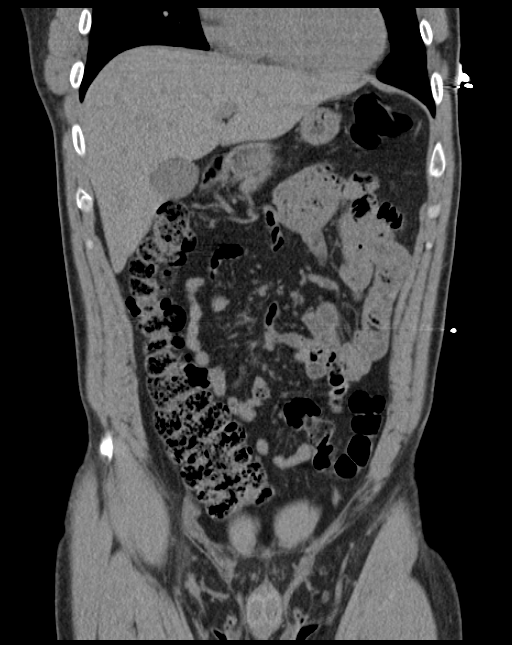
[im 37/83  soft-tissue]
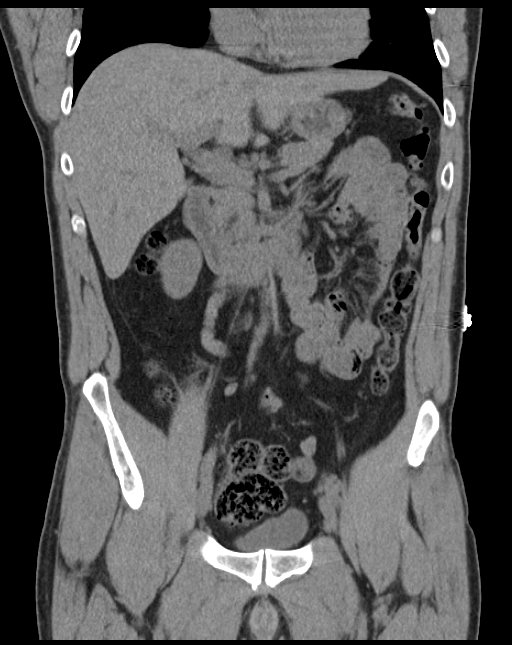
[im 46/83  soft-tissue]
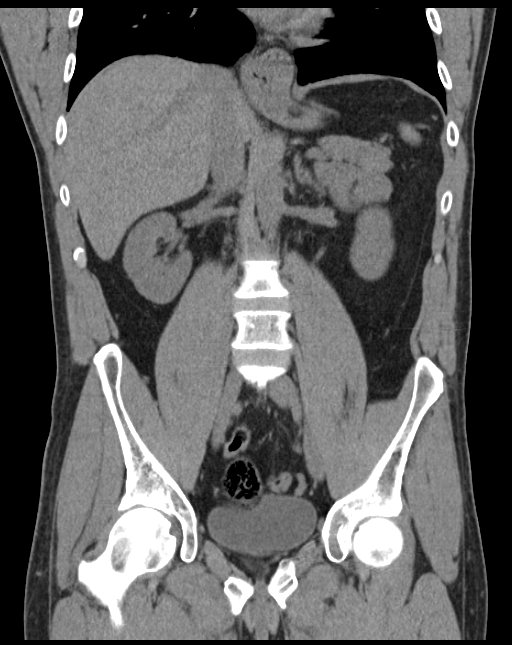

[Series 6: lung · axial · 0.74mm/px · z∈[+1596,+1706]mm · 13 of 25 slices shown, 15 images]
[im 2/25  soft-tissue]
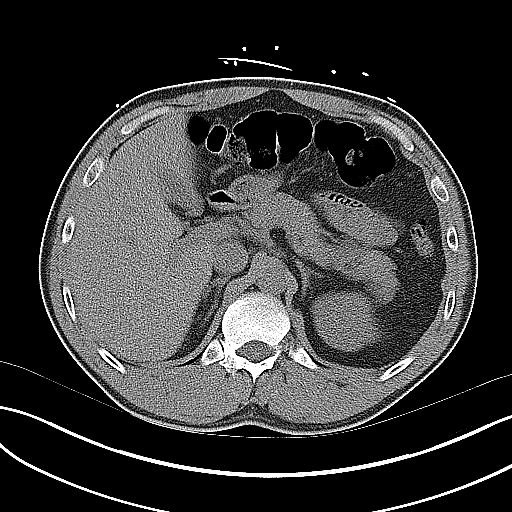
[im 2/25  bone]
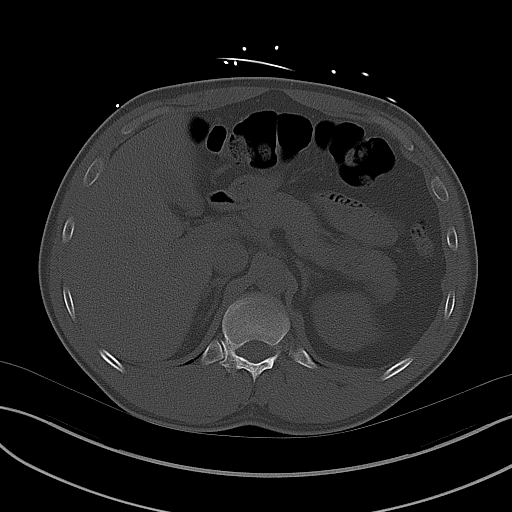
[im 4/25  soft-tissue]
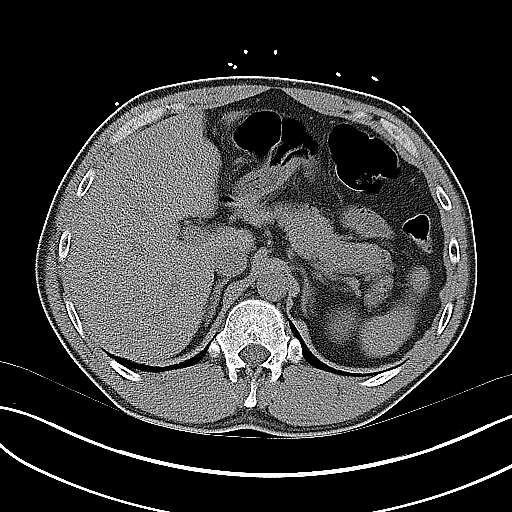
[im 6/25  soft-tissue]
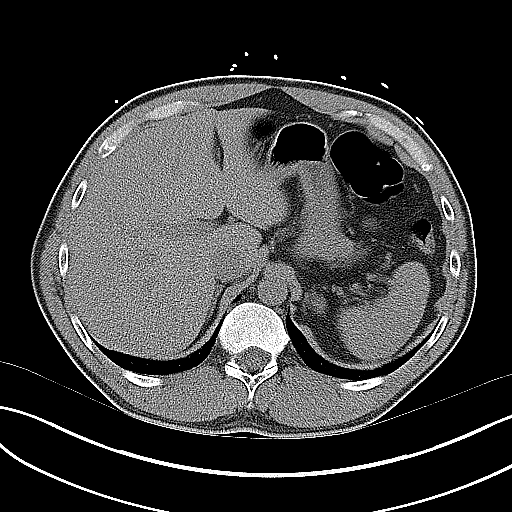
[im 8/25  soft-tissue]
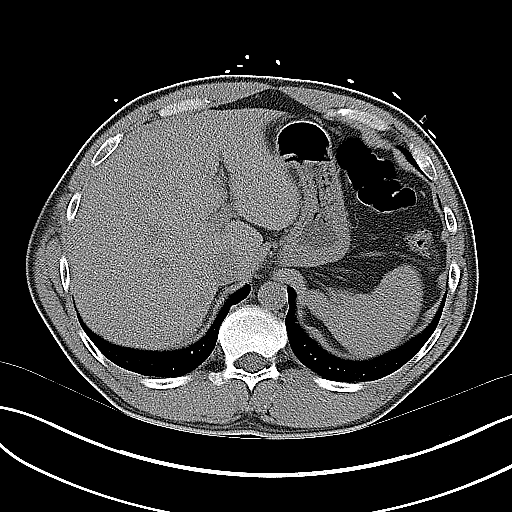
[im 9/25  soft-tissue]
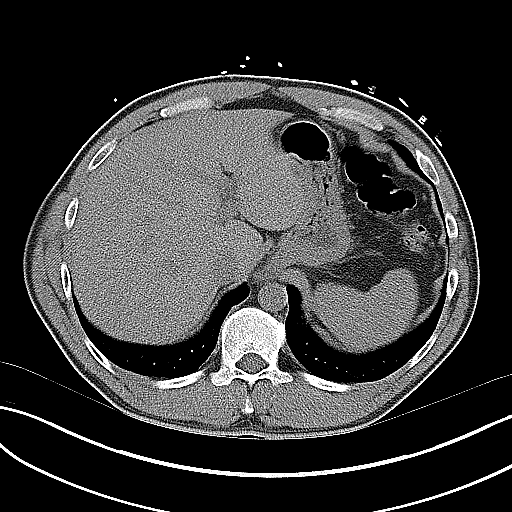
[im 11/25  soft-tissue]
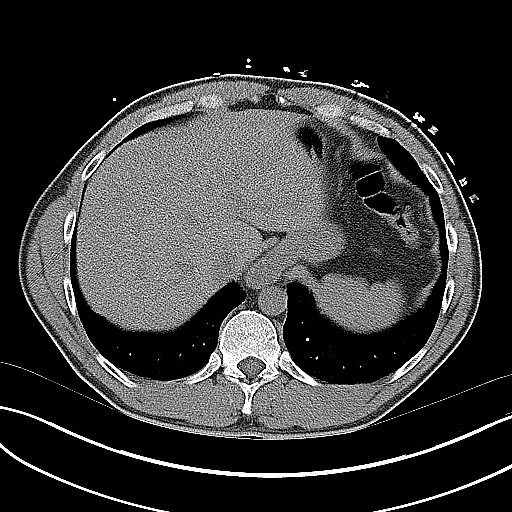
[im 13/25  soft-tissue]
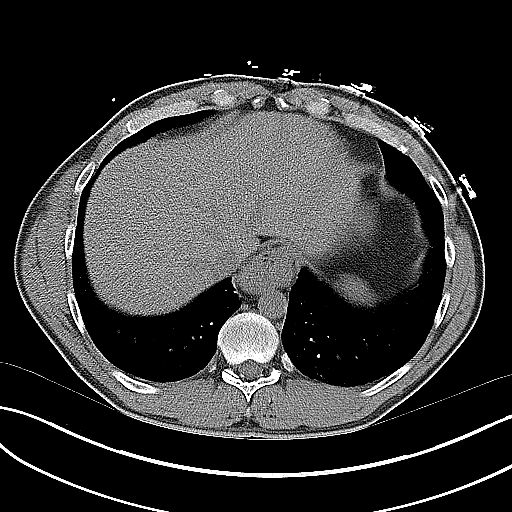
[im 15/25  soft-tissue]
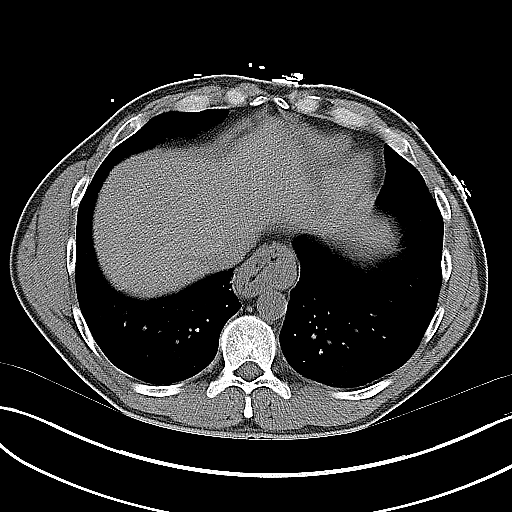
[im 17/25  soft-tissue]
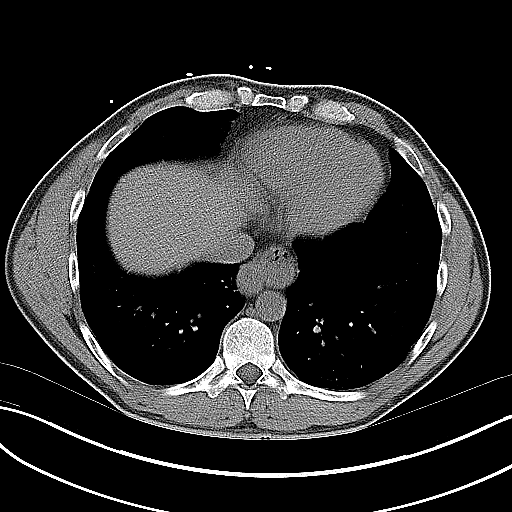
[im 17/25  bone]
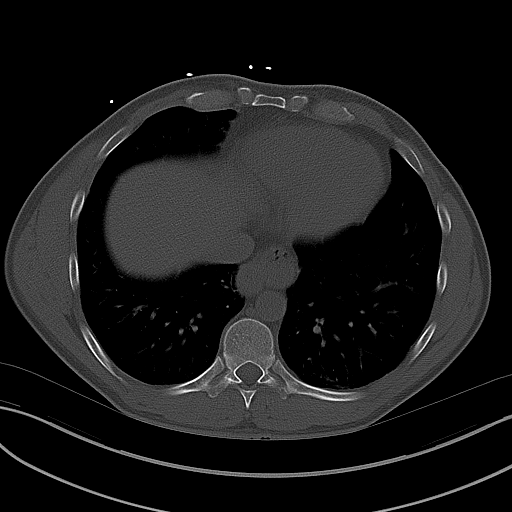
[im 18/25  soft-tissue]
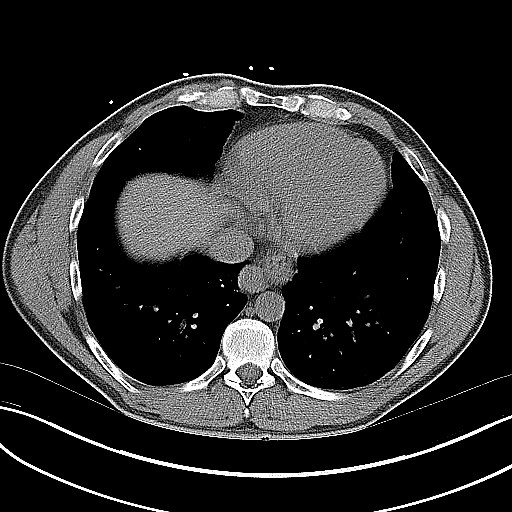
[im 20/25  soft-tissue]
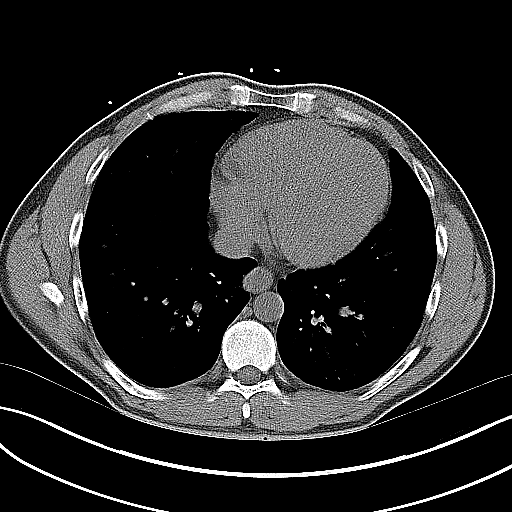
[im 22/25  soft-tissue]
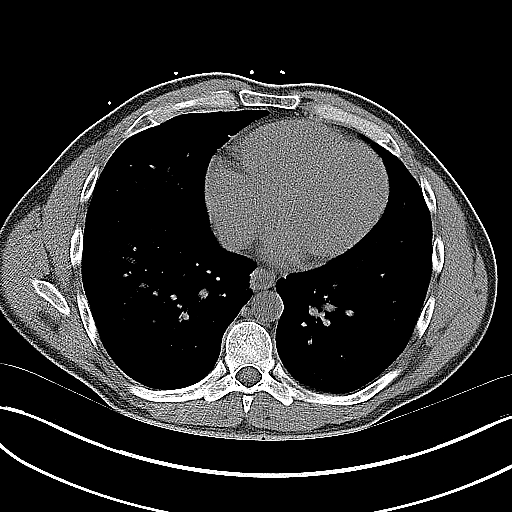
[im 24/25  soft-tissue]
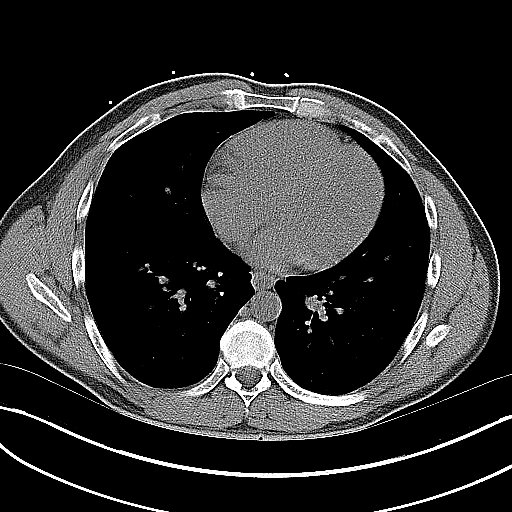

[16 of 39 positions shown; findings below may reference images not displayed]

FINDINGS: The visualized lung bases are clear.  A small hiatal hernia is seen.

The liver and spleen are unremarkable in appearance. The gallbladder
is within normal limits. The pancreas and adrenal glands are
unremarkable.

The kidneys are unremarkable in appearance. There is no evidence of
hydronephrosis. No renal or ureteral stones are seen. No perinephric
stranding is appreciated.

No free fluid is identified. The small bowel is unremarkable in
appearance. The stomach is within normal limits. No acute vascular
abnormalities are seen.

The appendix is normal in caliber, without evidence of appendicitis.
The colon is unremarkable in appearance.

The bladder is mildly distended and grossly unremarkable. The
prostate remains normal in size. No inguinal lymphadenopathy is
seen.

No acute osseous abnormalities are identified.
IMPRESSION: 1. No acute abnormality seen within the abdomen or pelvis.
2. Small hiatal hernia noted.
